# Patient Record
Sex: Male | Born: 1970 | Race: White | Hispanic: Yes | Marital: Married | Smoking: Light tobacco smoker
Health system: Southern US, Community
[De-identification: ages and names within clinical notes are randomized; demographics above are authoritative.]

## PROBLEM LIST (undated history)

## (undated) DIAGNOSIS — R7303 Prediabetes: Secondary | ICD-10-CM

## (undated) DIAGNOSIS — T7840XA Allergy, unspecified, initial encounter: Secondary | ICD-10-CM

## (undated) DIAGNOSIS — E785 Hyperlipidemia, unspecified: Secondary | ICD-10-CM

## (undated) DIAGNOSIS — E669 Obesity, unspecified: Secondary | ICD-10-CM

## (undated) DIAGNOSIS — I10 Essential (primary) hypertension: Secondary | ICD-10-CM

## (undated) HISTORY — DX: Allergy, unspecified, initial encounter: T78.40XA

## (undated) HISTORY — DX: Prediabetes: R73.03

## (undated) HISTORY — DX: Obesity, unspecified: E66.9

## (undated) HISTORY — DX: Hyperlipidemia, unspecified: E78.5

## (undated) HISTORY — DX: Essential (primary) hypertension: I10

---

## 1998-11-12 ENCOUNTER — Encounter: Admission: RE | Admit: 1998-11-12 | Discharge: 1998-11-12 | Payer: Self-pay | Admitting: Internal Medicine

## 1998-11-27 ENCOUNTER — Encounter: Admission: RE | Admit: 1998-11-27 | Discharge: 1998-11-27 | Payer: Self-pay | Admitting: Internal Medicine

## 1998-12-08 ENCOUNTER — Encounter: Admission: RE | Admit: 1998-12-08 | Discharge: 1998-12-08 | Payer: Self-pay | Admitting: Internal Medicine

## 1999-04-19 ENCOUNTER — Encounter: Admission: RE | Admit: 1999-04-19 | Discharge: 1999-04-19 | Payer: Self-pay | Admitting: Internal Medicine

## 2009-08-03 ENCOUNTER — Emergency Department (HOSPITAL_COMMUNITY): Admission: EM | Admit: 2009-08-03 | Discharge: 2009-08-03 | Payer: Self-pay | Admitting: Emergency Medicine

## 2013-10-01 ENCOUNTER — Emergency Department (HOSPITAL_COMMUNITY): Payer: Self-pay

## 2013-10-01 ENCOUNTER — Emergency Department (HOSPITAL_COMMUNITY)
Admission: EM | Admit: 2013-10-01 | Discharge: 2013-10-02 | Disposition: A | Discharge status: Home / Self Care | Payer: Self-pay | Attending: Emergency Medicine | Admitting: Emergency Medicine

## 2013-10-01 DIAGNOSIS — M545 Low back pain, unspecified: Secondary | ICD-10-CM

## 2013-10-01 DIAGNOSIS — S0993XA Unspecified injury of face, initial encounter: Secondary | ICD-10-CM | POA: Insufficient documentation

## 2013-10-01 DIAGNOSIS — IMO0002 Reserved for concepts with insufficient information to code with codable children: Secondary | ICD-10-CM | POA: Insufficient documentation

## 2013-10-01 DIAGNOSIS — R9431 Abnormal electrocardiogram [ECG] [EKG]: Secondary | ICD-10-CM | POA: Insufficient documentation

## 2013-10-01 DIAGNOSIS — R296 Repeated falls: Secondary | ICD-10-CM | POA: Insufficient documentation

## 2013-10-01 DIAGNOSIS — Y92009 Unspecified place in unspecified non-institutional (private) residence as the place of occurrence of the external cause: Secondary | ICD-10-CM | POA: Insufficient documentation

## 2013-10-01 DIAGNOSIS — R209 Unspecified disturbances of skin sensation: Secondary | ICD-10-CM | POA: Insufficient documentation

## 2013-10-01 DIAGNOSIS — R55 Syncope and collapse: Secondary | ICD-10-CM

## 2013-10-01 DIAGNOSIS — R011 Cardiac murmur, unspecified: Secondary | ICD-10-CM | POA: Insufficient documentation

## 2013-10-01 DIAGNOSIS — I10 Essential (primary) hypertension: Secondary | ICD-10-CM

## 2013-10-01 DIAGNOSIS — Y9389 Activity, other specified: Secondary | ICD-10-CM | POA: Insufficient documentation

## 2013-10-01 DIAGNOSIS — F1010 Alcohol abuse, uncomplicated: Secondary | ICD-10-CM | POA: Insufficient documentation

## 2013-10-01 DIAGNOSIS — W19XXXA Unspecified fall, initial encounter: Secondary | ICD-10-CM

## 2013-10-01 DIAGNOSIS — S199XXA Unspecified injury of neck, initial encounter: Secondary | ICD-10-CM

## 2013-10-01 DIAGNOSIS — R030 Elevated blood-pressure reading, without diagnosis of hypertension: Secondary | ICD-10-CM | POA: Insufficient documentation

## 2013-10-01 DIAGNOSIS — E876 Hypokalemia: Secondary | ICD-10-CM | POA: Insufficient documentation

## 2013-10-01 LAB — URINE MICROSCOPIC-ADD ON

## 2013-10-01 LAB — CBC
HCT: 39.2 % (ref 39.0–52.0)
HEMOGLOBIN: 14.1 g/dL (ref 13.0–17.0)
MCH: 31.4 pg (ref 26.0–34.0)
MCHC: 36 g/dL (ref 30.0–36.0)
MCV: 87.3 fL (ref 78.0–100.0)
PLATELETS: 259 10*3/uL (ref 150–400)
RBC: 4.49 MIL/uL (ref 4.22–5.81)
RDW: 13.6 % (ref 11.5–15.5)
WBC: 11.9 10*3/uL — ABNORMAL HIGH (ref 4.0–10.5)

## 2013-10-01 LAB — DIFFERENTIAL
BASOS ABS: 0.1 10*3/uL (ref 0.0–0.1)
BASOS PCT: 1 % (ref 0–1)
Eosinophils Absolute: 0.3 10*3/uL (ref 0.0–0.7)
Eosinophils Relative: 2 % (ref 0–5)
Lymphocytes Relative: 44 % (ref 12–46)
Lymphs Abs: 5.3 10*3/uL — ABNORMAL HIGH (ref 0.7–4.0)
MONO ABS: 0.7 10*3/uL (ref 0.1–1.0)
MONOS PCT: 6 % (ref 3–12)
NEUTROS ABS: 5.7 10*3/uL (ref 1.7–7.7)
NEUTROS PCT: 47 % (ref 43–77)

## 2013-10-01 LAB — URINALYSIS, ROUTINE W REFLEX MICROSCOPIC
BILIRUBIN URINE: NEGATIVE
Glucose, UA: 100 mg/dL — AB
Ketones, ur: NEGATIVE mg/dL
Leukocytes, UA: NEGATIVE
Nitrite: NEGATIVE
PH: 6 (ref 5.0–8.0)
Protein, ur: NEGATIVE mg/dL
Specific Gravity, Urine: 1.016 (ref 1.005–1.030)
Urobilinogen, UA: 0.2 mg/dL (ref 0.0–1.0)

## 2013-10-01 LAB — I-STAT TROPONIN, ED: Troponin i, poc: 0.03 ng/mL (ref 0.00–0.08)

## 2013-10-01 LAB — I-STAT CHEM 8, ED
BUN: 15 mg/dL (ref 6–23)
CALCIUM ION: 1.17 mmol/L (ref 1.12–1.23)
CREATININE: 1.3 mg/dL (ref 0.50–1.35)
Chloride: 103 mEq/L (ref 96–112)
GLUCOSE: 147 mg/dL — AB (ref 70–99)
HEMATOCRIT: 43 % (ref 39.0–52.0)
HEMOGLOBIN: 14.6 g/dL (ref 13.0–17.0)
Potassium: 3.1 mEq/L — ABNORMAL LOW (ref 3.7–5.3)
Sodium: 143 mEq/L (ref 137–147)
TCO2: 25 mmol/L (ref 0–100)

## 2013-10-01 LAB — PROTIME-INR
INR: 0.98 (ref 0.00–1.49)
Prothrombin Time: 12.8 seconds (ref 11.6–15.2)

## 2013-10-01 LAB — COMPREHENSIVE METABOLIC PANEL
ALBUMIN: 3.5 g/dL (ref 3.5–5.2)
ALK PHOS: 70 U/L (ref 39–117)
ALT: 71 U/L — AB (ref 0–53)
AST: 55 U/L — ABNORMAL HIGH (ref 0–37)
BILIRUBIN TOTAL: 0.3 mg/dL (ref 0.3–1.2)
BUN: 15 mg/dL (ref 6–23)
CO2: 23 mEq/L (ref 19–32)
CREATININE: 1.07 mg/dL (ref 0.50–1.35)
Calcium: 8.9 mg/dL (ref 8.4–10.5)
Chloride: 102 mEq/L (ref 96–112)
GFR calc Af Amer: >90 mL/min (ref >90)
GFR, EST NON AFRICAN AMERICAN: 83 mL/min — AB (ref >90)
Glucose, Bld: 146 mg/dL — ABNORMAL HIGH (ref 70–99)
Potassium: 3.3 mEq/L — ABNORMAL LOW (ref 3.7–5.3)
Sodium: 143 mEq/L (ref 137–147)
TOTAL PROTEIN: 7.7 g/dL (ref 6.0–8.3)

## 2013-10-01 LAB — CBG MONITORING, ED: Glucose-Capillary: 170 mg/dL — ABNORMAL HIGH (ref 70–99)

## 2013-10-01 LAB — ETHANOL: ALCOHOL ETHYL (B): 104 mg/dL — AB (ref 0–11)

## 2013-10-01 LAB — APTT: APTT: 28 s (ref 24–37)

## 2013-10-01 MED ORDER — SODIUM CHLORIDE 0.9 % IV BOLUS (SEPSIS)
1000.0000 mL | Freq: Once | INTRAVENOUS | Status: AC
  Administered 2013-10-02: 1000 mL via INTRAVENOUS

## 2013-10-01 MED ORDER — FENTANYL CITRATE 0.05 MG/ML IJ SOLN
50.0000 ug | Freq: Once | INTRAMUSCULAR | Status: DC
  Filled 2013-10-01: qty 2

## 2013-10-01 NOTE — Code Documentation (Signed)
43 yo hm brought in via [facility] s/p fall & possible seizure activity.  Per report pt was engaged in an argument on the phone when he suddenly fell flat on his back & began to shake.  Pt has no reported hx of seizure.  Pt has hx of HTN but is noncompliant with his HCTZ regimen.  [facility] blurred vision, lumbar pain, & BLE numbness.  After CT scan symptoms had improved to Rt hip pain & LLE slight numbness.  See doc flow sheet for code stroke times.  NIH 1 for sensory deficit.  Code stroke cancelled.

## 2013-10-01 NOTE — ED Notes (Addendum)
Pt to ED via [facility], code stroke called en route.  Family reports pt was standing having an argument when he dropped to the ground and had seizure like activity- no hx of same, pt reports blurred vision afterwards- denies weakness.  Pt fully immobilized upon arrival to ED due to fall, c/o of lower back pain and head pain.  Initial BP 240/140, last BP for EMS 200/124.  Pt Spanish speaking only.

## 2013-10-01 NOTE — Consult Note (Signed)
Referring Physician: Jodi Mourning    Chief Complaint: Syncope  HPI: Austin Moreno is a 43 y.o. male who was at home today after work in a heated discussion with someone on the phone.  The family heard a thud and found the patient on his back.  Some shaking was noted.  There was not tongue biting and no bowel or bladder incontinence.  EMS was called and the patient complained of blurry vision, back pain and lower extremity numbness.  Patient was brought in as a code stroke.  Symptoms began to improve in the ED.  NIHSS of 1.    Date last known well: Date: 10/01/2013 Time last known well: Time: 21:25 tPA Given: No: Improvement in symptoms  Past medical history: Hypertension  Past surgical history: None  Family history: Son alive and well  Social History:  The patient has no history of tobacco or illicit drug abuse.  He does drink alcohol on a daily basis.    Allergies: No Known Allergies  Medications: I have reviewed the patient's current medications. Prior to Admission:  Patient is noncompliant with his HCTZ and Neurontin  ROS: History obtained from the patient  General ROS: negative for - chills, fatigue, fever, night sweats, weight gain or weight loss Psychological ROS: negative for - behavioral disorder, hallucinations, memory difficulties, mood swings or suicidal ideation Ophthalmic ROS: negative for - blurry vision, double vision, eye pain or loss of vision ENT ROS: negative for - epistaxis, nasal discharge, oral lesions, sore throat, tinnitus or vertigo Allergy and Immunology ROS: negative for - hives or itchy/watery eyes Hematological and Lymphatic ROS: negative for - bleeding problems, bruising or swollen lymph nodes Endocrine ROS: negative for - galactorrhea, hair pattern changes, polydipsia/polyuria or temperature intolerance Respiratory ROS: negative for - cough, hemoptysis, shortness of breath or wheezing Cardiovascular ROS: negative for - chest pain, dyspnea on exertion,  edema or irregular heartbeat Gastrointestinal ROS: negative for - abdominal pain, diarrhea, hematemesis, nausea/vomiting or stool incontinence Genito-Urinary ROS: negative for - dysuria, hematuria, incontinence or urinary frequency/urgency Musculoskeletal ZOX:WRUE pain Neurological ROS: as noted in HPI Dermatological ROS: negative for rash and skin lesion changes  Physical Examination: Blood pressure 160/98, pulse 97, temperature 98.6 F (37 C), temperature source Oral, resp. rate 20, SpO2 100.00%.  Neurologic Examination: Mental Status: Alert, oriented, thought content appropriate.  Speech fluent without evidence of aphasia.  Able to follow 3 step commands without difficulty. Cranial Nerves: II: Discs flat bilaterally; Visual fields grossly normal, pupils equal, round, reactive to light and accommodation III,IV, VI: ptosis not present, extra-ocular motions intact bilaterally V,VII: smile symmetric, facial light touch sensation normal bilaterally VIII: hearing normal bilaterally IX,X: gag reflex present XI: bilateral shoulder shrug XII: midline tongue extension Motor: Right : Upper extremity   5/5    Left:     Upper extremity   5/5  Lower extremity   5/5     Lower extremity   5/5 Tone and bulk:normal tone throughout; no atrophy noted Sensory: Pinprick and light touch decreased in the LLE Deep Tendon Reflexes: 2+ throughout with absent AJ's bilaterally Plantars: Right: mute   Left: mute Cerebellar: normal finger-to-nose and normal heel-to-shin test Gait: Unable to test CV: pulses palpable throughout     Laboratory Studies:  Basic Metabolic Panel:  Recent Labs Lab 10/01/13 2215 10/01/13 2224  NA 143 143  K 3.3* 3.1*  CL 102 103  CO2 23  --   GLUCOSE 146* 147*  BUN 15 15  CREATININE 1.07 1.30  CALCIUM  8.9  --     Liver Function Tests:  Recent Labs Lab 10/01/13 2215  AST 55*  ALT 71*  ALKPHOS 70  BILITOT 0.3  PROT 7.7  ALBUMIN 3.5   No results found for  this basename: LIPASE, AMYLASE,  in the last 168 hours No results found for this basename: AMMONIA,  in the last 168 hours  CBC:  Recent Labs Lab 10/01/13 2215 10/01/13 2224  WBC 11.9*  --   NEUTROABS 5.7  --   HGB 14.1 14.6  HCT 39.2 43.0  MCV 87.3  --   PLT 259  --     Cardiac Enzymes: No results found for this basename: CKTOTAL, CKMB, CKMBINDEX, TROPONINI,  in the last 168 hours  BNP: No components found with this basename: POCBNP,   CBG:  Recent Labs Lab 10/01/13 2236  GLUCAP 170*    Microbiology: No results found for this or any previous visit.  Coagulation Studies:  Recent Labs  10/01/13 2215  LABPROT 12.8  INR 0.98    Urinalysis: No results found for this basename: COLORURINE, APPERANCEUR, LABSPEC, PHURINE, GLUCOSEU, HGBUR, BILIRUBINUR, KETONESUR, PROTEINUR, UROBILINOGEN, NITRITE, LEUKOCYTESUR,  in the last 168 hours  Lipid Panel: No results found for this basename: chol, trig, hdl, cholhdl, vldl, ldlcalc    HgbA1C:  No results found for this basename: HGBA1C    Urine Drug Screen:   No results found for this basename: labopia, cocainscrnur, labbenz, amphetmu, thcu, labbarb    Alcohol Level:  Recent Labs Lab 10/01/13 2215  ETH 104*    Other results: EKG: sinus rhythm at 97 bpm.  Imaging: Ct Head Wo Contrast  10/01/2013   CLINICAL DATA:  Code stroke.  Blurred vision and syncope.  EXAM: CT HEAD WITHOUT CONTRAST  CT CERVICAL SPINE WITHOUT CONTRAST  TECHNIQUE: Multidetector CT imaging of the head and cervical spine was performed following the standard protocol without intravenous contrast. Multiplanar CT image reconstructions of the cervical spine were also generated.  COMPARISON:  None.  FINDINGS: CT HEAD FINDINGS  Skull and Sinuses:No significant abnormality.  Orbits: No acute abnormality.  Brain: No evidence of acute abnormality, such as acute infarction, hemorrhage, hydrocephalus, or mass lesion/mass effect. There is multi focal coarse  parenchymal calcification, noted in the peripheral bilateral cerebral hemispheres and in the left thalamus. No evidence of surrounding edema. No mass effect.  CT CERVICAL SPINE FINDINGS  Negative for acute fracture or subluxation. No prevertebral edema. No gross cervical canal hematoma. There is likely a disc herniation centrally at C6-7, unlikely to cause significant canal stenosis. No significant osseous canal or foraminal stenosis.  These results were called by telephone at the time of interpretation on 10/01/2013 at 10:37 PM to Dr. Marena Chancy , who verbally acknowledged these results.  IMPRESSION: 1. Negative for hemorrhage or acute intracranial infarct. 2. 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 No evidence of edema.   Electronically Signed   By: Tiburcio Pea M.D.   On: 10/01/2013 22:38   Ct Cervical Spine Wo Contrast  10/01/2013   CLINICAL DATA:  Code stroke.  Blurred vision and syncope.  EXAM: CT HEAD WITHOUT CONTRAST  CT CERVICAL SPINE WITHOUT CONTRAST  TECHNIQUE: Multidetector CT imaging of the head and cervical spine was performed following the standard protocol without intravenous contrast. Multiplanar CT image reconstructions of the cervical spine were also generated.  COMPARISON:  None.  FINDINGS: CT HEAD FINDINGS  Skull and Sinuses:No significant abnormality.  Orbits: No acute abnormality.  Brain: No evidence of acute  abnormality, such as acute infarction, hemorrhage, hydrocephalus, or mass lesion/mass effect. There is multi focal coarse parenchymal calcification, noted in the peripheral bilateral cerebral hemispheres and in the left thalamus. No evidence of surrounding edema. No mass effect.  CT CERVICAL SPINE FINDINGS  Negative for acute fracture or subluxation. No prevertebral edema. No gross cervical canal hematoma. There is likely a disc herniation centrally at C6-7, unlikely to cause significant canal stenosis. No significant osseous canal or foraminal  stenosis.  These results were called by telephone at the time of interpretation on 10/01/2013 at 10:37 PM to Dr. Marena ChancyWILLIAM WALDEN , who verbally acknowledged these results.  IMPRESSION: 1. Negative for hemorrhage or acute intracranial infarct. 2. 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 No evidence of edema.   Electronically Signed   By: Tiburcio PeaJonathan  Watts M.D.   On: 10/01/2013 22:38    Assessment: 43 y.o. male presenting after a syncopal episode with numbness and blurry vision.  His symptoms are improving in the ED.  BP is elevated and patient is noncompliant with medication.  Presentation not felt to be indicative of an acute infarct.  I feel it is difficult to say with some definity that he had a seizure as well.  Head CT reviewed and shows no acute changes but does show some areas of calcification.    Stroke Risk Factors - hypertension  Plan: 1. Due to continued back pain would image the lumbar spine 2.  No further neurologic intervention is recommended at this time.  If further questions arise, please call or page at that time.  Thank you for allowing neurology to participate in the care of this patient.  Anticonvulsant therapy not indicated at this time.    Thana FarrLeslie Roselani Grajeda, MD [facility] [phone] 10/02/2013  12:11 AM

## 2013-10-02 ENCOUNTER — Emergency Department (HOSPITAL_COMMUNITY): Payer: Self-pay

## 2013-10-02 DIAGNOSIS — R55 Syncope and collapse: Secondary | ICD-10-CM

## 2013-10-02 LAB — RAPID URINE DRUG SCREEN, HOSP PERFORMED
AMPHETAMINES: NOT DETECTED
BARBITURATES: NOT DETECTED
BENZODIAZEPINES: NOT DETECTED
COCAINE: NOT DETECTED
Opiates: NOT DETECTED
Tetrahydrocannabinol: NOT DETECTED

## 2013-10-02 LAB — I-STAT TROPONIN, ED: Troponin i, poc: 0.04 ng/mL (ref 0.00–0.08)

## 2013-10-02 MED ORDER — POTASSIUM CHLORIDE CRYS ER 20 MEQ PO TBCR
40.0000 meq | EXTENDED_RELEASE_TABLET | Freq: Once | ORAL | Status: AC
  Administered 2013-10-02: 40 meq via ORAL
  Filled 2013-10-02: qty 2

## 2013-10-02 NOTE — ED Provider Notes (Signed)
CSN: [id number]     Arrival date & time 10/01/13  2209 History   First MD Initiated Contact with Patient 10/01/13 2254     Chief Complaint  Patient presents with  . Code Stroke    An emergency department physician performed an initial assessment on this suspected stroke patient at 2209. (Consider location/radiation/quality/duration/timing/severity/associated sxs/prior Treatment) HPI Comments: 43 year old male with no medical or surgical history presents after a fall prior to arrival. Patient was at work today for which she normally and had lower back pain throughout the day. He got home and his lower back pain worsened leading to numbness in both legs causing and likes to give out leading him to fall onto his back. He had no chest pain, shortness of breath, bowel or bladder changes, headache prior to or afterwards.  He denies syncope and recalls all events.  Patient is not on blood thinners. Currently patient has mild neck and lumbar back pain worse with movement.Patient denies urinary or bowel changes, active cancer, extremity weakness, IVDU, fevers, immunosuppression or significant trauma.   The history is provided by the patient. A language interpreter was used.    No past medical history on file. No past surgical history on file. No family history on file. History  Substance Use Topics  . Smoking status: Not on file  . Smokeless tobacco: Not on file  . Alcohol Use: Not on file    Review of Systems  Constitutional: Negative for fever and chills.  HENT: Negative for congestion.   Eyes: Negative for visual disturbance.  Respiratory: Negative for shortness of breath.   Cardiovascular: Negative for chest pain.  Gastrointestinal: Negative for vomiting and abdominal pain.  Genitourinary: Negative for dysuria and flank pain.  Musculoskeletal: Positive for back pain and neck pain. Negative for neck stiffness.  Skin: Negative for rash.  Neurological: Positive for numbness. Negative for  syncope, weakness, light-headedness and headaches.      Allergies  Review of patient's allergies indicates no known allergies.  Home Medications  No current outpatient prescriptions on file. BP 160/98  Pulse 97  Temp(Src) 98.6 F (37 C) (Oral)  Resp 20  SpO2 100% Physical Exam  Nursing note and vitals reviewed. Constitutional: He is oriented to person, place, and time. He appears well-developed and well-nourished.  HENT:  Head: Normocephalic and atraumatic.  Eyes: Conjunctivae are normal. Right eye exhibits no discharge. Left eye exhibits no discharge.  Neck: Normal range of motion. Neck supple. No tracheal deviation present.  Cardiovascular: Normal rate and regular rhythm.   Murmur (2+ SM LSB) heard. Pulmonary/Chest: Effort normal and breath sounds normal.  Abdominal: Soft. He exhibits no distension. There is no tenderness. There is no guarding.  Musculoskeletal: He exhibits tenderness. He exhibits no edema.  Mild tenderness midline lumbar and paraspinal lumbar, mild tenderness lower cervical spinal, full range of motion of head and neck without significant pain.  Neurological: He is alert and oriented to person, place, and time. Coordination normal. GCS eye subscore is 4. GCS verbal subscore is 5. GCS motor subscore is 6.  5+ strength in UE and LE with f/e at major joints. Sensation to palpation intact in UE and LE. CNs 2-12 grossly intact.  EOMFI.  PERRL.   Finger nose and coordination intact bilateral.   Visual fields intact to finger testing.   Skin: Skin is warm. No rash noted.  Psychiatric: He has a normal mood and affect.    ED Course  Procedures (including critical care time) Labs Review Labs  Reviewed  ETHANOL - Abnormal; Notable for the following:    Alcohol, Ethyl (B) 104 (*)    All other components within normal limits  CBC - Abnormal; Notable for the following:    WBC 11.9 (*)    All other components within normal limits  DIFFERENTIAL - Abnormal; Notable  for the following:    Lymphs Abs 5.3 (*)    All other components within normal limits  COMPREHENSIVE METABOLIC PANEL - Abnormal; Notable for the following:    Potassium 3.3 (*)    Glucose, Bld 146 (*)    AST 55 (*)    ALT 71 (*)    GFR calc non Af Amer 83 (*)    All other components within normal limits  URINALYSIS, ROUTINE W REFLEX MICROSCOPIC - Abnormal; Notable for the following:    APPearance CLOUDY (*)    Glucose, UA 100 (*)    Hgb urine dipstick SMALL (*)    All other components within normal limits  I-STAT CHEM 8, ED - Abnormal; Notable for the following:    Potassium 3.1 (*)    Glucose, Bld 147 (*)    All other components within normal limits  CBG MONITORING, ED - Abnormal; Notable for the following:    Glucose-Capillary 170 (*)    All other components within normal limits  PROTIME-INR  APTT  URINE MICROSCOPIC-ADD ON  URINE RAPID DRUG SCREEN (HOSP PERFORMED)  I-STAT TROPOININ, ED  I-STAT TROPOININ, ED   Imaging Review Ct Head Wo Contrast  10/01/2013   CLINICAL DATA:  Code stroke.  Blurred vision and syncope.  EXAM: CT HEAD WITHOUT CONTRAST  CT CERVICAL SPINE WITHOUT CONTRAST  TECHNIQUE: Multidetector CT imaging of the head and cervical spine was performed following the standard protocol without intravenous contrast. Multiplanar CT image reconstructions of the cervical spine were also generated.  COMPARISON:  None.  FINDINGS: CT HEAD FINDINGS  Skull and Sinuses:No significant abnormality.  Orbits: No acute abnormality.  Brain: No evidence of acute abnormality, such as acute infarction, hemorrhage, hydrocephalus, or mass lesion/mass effect. There is multi focal coarse parenchymal calcification, noted in the peripheral bilateral cerebral hemispheres and in the left thalamus. No evidence of surrounding edema. No mass effect.  CT CERVICAL SPINE FINDINGS  Negative for acute fracture or subluxation. No prevertebral edema. No gross cervical canal hematoma. There is likely a disc  herniation centrally at C6-7, unlikely to cause significant canal stenosis. No significant osseous canal or foraminal stenosis.  These results were called by telephone at the time of interpretation on 10/01/2013 at 10:37 PM to Dr. Marena Chancy , who verbally acknowledged these results.  IMPRESSION: 1. Negative for hemorrhage or acute intracranial infarct. 2. 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 No evidence of edema.   Electronically Signed   By: Tiburcio Pea M.D.   On: 10/01/2013 22:38   Ct Cervical Spine Wo Contrast  10/01/2013   CLINICAL DATA:  Code stroke.  Blurred vision and syncope.  EXAM: CT HEAD WITHOUT CONTRAST  CT CERVICAL SPINE WITHOUT CONTRAST  TECHNIQUE: Multidetector CT imaging of the head and cervical spine was performed following the standard protocol without intravenous contrast. Multiplanar CT image reconstructions of the cervical spine were also generated.  COMPARISON:  None.  FINDINGS: CT HEAD FINDINGS  Skull and Sinuses:No significant abnormality.  Orbits: No acute abnormality.  Brain: No evidence of acute abnormality, such as acute infarction, hemorrhage, hydrocephalus, or mass lesion/mass effect. There is multi focal coarse parenchymal calcification, noted in the peripheral bilateral  cerebral hemispheres and in the left thalamus. No evidence of surrounding edema. No mass effect.  CT CERVICAL SPINE FINDINGS  Negative for acute fracture or subluxation. No prevertebral edema. No gross cervical canal hematoma. There is likely a disc herniation centrally at C6-7, unlikely to cause significant canal stenosis. No significant osseous canal or foraminal stenosis.  These results were called by telephone at the time of interpretation on 10/01/2013 at 10:37 PM to Dr. Marena Chancy , who verbally acknowledged these results.  IMPRESSION: 1. Negative for hemorrhage or acute intracranial infarct. 2. 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  No evidence of edema.   Electronically Signed   By: Tiburcio Pea M.D.   On: 10/01/2013 22:38     EKG Interpretation   Date/Time:  Tuesday October 01 2013 22:50:50 EDT Ventricular Rate:  97 PR Interval:  145 QRS Duration: 86 QT Interval:  343 QTC Calculation: 436 R Axis:   72 Text Interpretation:  Sinus rhythm Borderline repolarization abnormality  Non specific ST changes Confirmed by Jodi Mourning  MD, Nikitia Asbill ([id number]) on 10/02/2013  2:01:41 AM     Repeat EKG  Date: 10/02/2013  Rate: 93  Rhythm: normal sinus rhythm  QRS Axis: normal  Intervals: normal  ST/T Wave abnormalities: nonspecific ST changes and nonspecific T wave changes  Conduction Disutrbances:none  Narrative Interpretation:   Old EKG Reviewed: unchanged    MDM   Final diagnoses:  Lumbar back pain  Fall  Alcohol abuse  Hypokalemia  Heart murmur  High blood pressure   Clinically patient had worsening musculoskeletal versus lumbar disc pain leading to a fall. Patient has no significant medical history however he also doesn't followup the patient. Patient has a normal neuro exam including normal sensation and strength in the lower extremities in all major nerves. Patient initially had a stroke workup done. He has no signs of acute stroke on exam. Plan for pain meds, IV fluid bolus and followup imaging results. Family is in the room with patient and interpreter used. Patient did have a systolic murmur on exam no history of known murmur, no syncope or lightheadedness, discussed close outpatient followup with patient. PO K given.  Recheck patient feels improved. Normal neurologic exam. Discussed results. Discussed importance of primary care physician to followup multiple abnormalities. Patient denies syncope, chest pain, shortness of breath.  I do not feel this was an acute cardiac event. Delta to troponin negative. EKG abnormal however no acute changes on repeat.  Blood pressure elevated likely combination of pain and  underlying high blood pressure. No signs of endorgan damage clinically. Patient will followup with the primary care provider for further evaluation of heart murmur, high blood pressure, back pain. Strict reasons to return given including passing out, chest pain, fevers, leg weakness, numbness, bowel or bladder changes, other. Family in the room during this discussion. Results and differential diagnosis were discussed with the patient. Close follow up outpatient was discussed, patient comfortable with the plan.   Filed Vitals:   10/01/13 2327 10/01/13 2345 10/02/13 0209 10/02/13 0239  BP: 160/98 158/100 161/86 159/95  Pulse: 97 102 90 89  Temp:      TempSrc:      Resp: 20 19 18 22   SpO2: 100% 100% 99% 96%         Enid Skeens, MD 10/02/13 [phone]

## 2013-10-02 NOTE — Discharge Instructions (Signed)
If you were given medicines take as directed.  If you are on coumadin or contraceptives realize their levels and effectiveness is altered by many different medicines.  If you have any reaction (rash, tongues swelling, other) to the medicines stop taking and see a physician.   Please follow up as directed and return to the ER or see a physician for new or worsening symptoms.  Thank you.  It is very important for you to obtain a primary care physician to monitor your blood pressure, your heart murmur and your overall health. If you have any weakness or recurrent numbness in your legs, changes in your bladder or urination, fevers, passing out or any concerns return to the ER immediately.  Si le dieron medicamentos segn las indicaciones . Si usted est en Coumadin o anticonceptivos realidad sus niveles y la eficacia se ve alterada por muchos medicamentos diferentes . Si usted tiene Freight forwarderalguna reaccin (sarpullido , hinchazn lenguas , otros) a los medicamentos deje de tomar y Patent attorneyver a un mdico. Por favor, seguimiento como se indica y Programme researcher, broadcasting/film/videovolver a la sala de Sports administratoremergencias o ver a un mdico para los sntomas nuevos o que [place] . Gracias.  Es muy importante que usted obtenga un mdico de atencin primaria para Chief Operating Officercontrolar su presin arterial, su soplo en el corazn y Special educational needs teachersu salud en general . Si usted tiene alguna debilidad o entumecimiento recurrente en las piernas , los cambios en la vejiga o al Alexanderorinar , [place]bre, [place]o o cualquier preocupacin regresar a la sala de emergencias de inmediato . Filed Vitals:   10/01/13 2244 10/01/13 2315 10/01/13 2327 10/01/13 2345  BP: 186/106 160/98 160/98 158/100  Pulse: 98 97 97 102  Temp: 98.6 F (37 C)     TempSrc: Oral     Resp: 18 18 20 19   SpO2: 100% 100% 100% 100%

## 2014-07-04 DIAGNOSIS — E669 Obesity, unspecified: Secondary | ICD-10-CM

## 2014-07-04 HISTORY — DX: Obesity, unspecified: E66.9

## 2014-12-11 ENCOUNTER — Ambulatory Visit: Payer: Self-pay

## 2015-10-29 ENCOUNTER — Ambulatory Visit: Payer: Self-pay | Admitting: Internal Medicine

## 2015-12-03 ENCOUNTER — Ambulatory Visit (INDEPENDENT_AMBULATORY_CARE_PROVIDER_SITE_OTHER): Payer: Self-pay | Admitting: Internal Medicine

## 2015-12-03 ENCOUNTER — Encounter: Payer: Self-pay | Admitting: Internal Medicine

## 2015-12-03 VITALS — BP 180/112 | HR 90 | Temp 98.6°F | Resp 20 | Ht 61.75 in | Wt 200.0 lb

## 2015-12-03 DIAGNOSIS — I10 Essential (primary) hypertension: Secondary | ICD-10-CM | POA: Insufficient documentation

## 2015-12-03 DIAGNOSIS — E785 Hyperlipidemia, unspecified: Secondary | ICD-10-CM | POA: Insufficient documentation

## 2015-12-03 DIAGNOSIS — T7840XA Allergy, unspecified, initial encounter: Secondary | ICD-10-CM | POA: Insufficient documentation

## 2015-12-03 DIAGNOSIS — R7303 Prediabetes: Secondary | ICD-10-CM

## 2015-12-03 DIAGNOSIS — R7309 Other abnormal glucose: Secondary | ICD-10-CM

## 2015-12-03 LAB — GLUCOSE, POCT (MANUAL RESULT ENTRY): POC Glucose: 83 mg/dl (ref 70–99)

## 2015-12-03 MED ORDER — METOPROLOL TARTRATE 50 MG PO TABS
50.0000 mg | ORAL_TABLET | Freq: Two times a day (BID) | ORAL | Status: DC
Start: 2015-12-03 — End: 2016-08-16

## 2015-12-03 NOTE — Patient Instructions (Signed)

## 2015-12-03 NOTE — Progress Notes (Signed)
   Subjective:    Patient ID: Austin Moreno, male    DOB: 09/30/1970, 45 y.o.   MRN: [id number]  HPI  Has not been here in 1 year.  Records in [facility]   1. Essential Hypertension:  Stopped taking Metoprolol as he was feeling well.  States he actually only took the medicine for 1 month and then did not refill. His wife told him to take his medicine when he started feeling poorly 3 months ago, but he wanted to wait until he came to be seen.   Feeling dizzy mainly--spinning sensation.  2.  Prediabetes:  Last visit 1 year ago this was first addressed.  A1C:  5.7%.  Describes a fair diet, but eats breakfast biscuits a lot in the morning and then nothing until dinner at night.    3.  Hypercholesterolemia with good HDL in the past.  Again, long discussion regarding diet.  4. Decreased Visual Acuity:  Needs optometry appt. And likely glasses, describes presbyopia.  Meds:  None save for Alka Seltzer for headaches  No Known Allergies      Review of Systems     Objective:   Physical Exam NAD Lungs:  CTA CV:  RRR with normal S1 and S2, No S3, S4 or murmur.   Radial pulses normal and equal No LE edema       Assessment & Plan:  1.  Essential Hypertension:  Long discussion regarding likely life long treatment of his hypertension. Restart Metoprolol 50 mg twice daily BP check with nurse in 1 week. F/U with me in 2 months  2.  Prediabetes:  Long discussion of lifestyle and recommended changes with diet and physical activity.  3.  Hyperlipidemia:  As above.

## 2016-02-05 ENCOUNTER — Ambulatory Visit: Payer: Self-pay | Admitting: Internal Medicine

## 2016-03-31 ENCOUNTER — Ambulatory Visit: Payer: Self-pay | Admitting: Internal Medicine

## 2016-07-11 ENCOUNTER — Ambulatory Visit: Payer: Self-pay

## 2016-08-16 ENCOUNTER — Ambulatory Visit (INDEPENDENT_AMBULATORY_CARE_PROVIDER_SITE_OTHER): Payer: Self-pay | Admitting: Internal Medicine

## 2016-08-16 ENCOUNTER — Encounter: Payer: Self-pay | Admitting: Internal Medicine

## 2016-08-16 VITALS — BP 139/110 | HR 68 | Resp 12 | Ht 62.0 in | Wt 217.0 lb

## 2016-08-16 DIAGNOSIS — E669 Obesity, unspecified: Secondary | ICD-10-CM | POA: Insufficient documentation

## 2016-08-16 DIAGNOSIS — Z6839 Body mass index (BMI) 39.0-39.9, adult: Secondary | ICD-10-CM

## 2016-08-16 DIAGNOSIS — E6609 Other obesity due to excess calories: Secondary | ICD-10-CM

## 2016-08-16 DIAGNOSIS — IMO0001 Reserved for inherently not codable concepts without codable children: Secondary | ICD-10-CM

## 2016-08-16 DIAGNOSIS — Z79899 Other long term (current) drug therapy: Secondary | ICD-10-CM

## 2016-08-16 DIAGNOSIS — Z23 Encounter for immunization: Secondary | ICD-10-CM

## 2016-08-16 DIAGNOSIS — I10 Essential (primary) hypertension: Secondary | ICD-10-CM

## 2016-08-16 DIAGNOSIS — R7303 Prediabetes: Secondary | ICD-10-CM

## 2016-08-16 MED ORDER — AMLODIPINE BESYLATE 10 MG PO TABS: 10.0000 mg | ORAL_TABLET | Freq: Every day | ORAL | 11 refills | Status: DC

## 2016-08-16 NOTE — Progress Notes (Signed)
   Subjective:    Patient ID: Austin Moreno, male    DOB: 09/27/1970, 46 y.o.   MRN: [id number]  HPI   1.  Essential Hypertension:  Not taking Metoprolol as prescribed.  Has not filled for some time and often was forgetting his evening dose. Has also gained 17 lbs since seen in June.  States he was not working in past month so sat at home and ate. Using Herbalife drinks this month as well. Drinking  3 sodas daily.     2.  Prediabetes:  As above.  Long discussion regarding need to take care of his health so he can continue to work. He is fasting today.  3.  Decreased vision:  Noted he is not able to read his bp on chart today.  Has not tried reading glasses.  Discussed will need eye referral if has developed DM at this point with weight gain and poor diet.  4.  Obesity:  As above.    Current Meds  Medication Sig  . aspirin-sod bicarb-citric acid (ALKA-SELTZER) 325 MG TBEF tablet Take 325 mg by mouth as needed.  . fexofenadine (ALLEGRA) 180 MG tablet Take 180 mg by mouth daily. Reported on 12/03/2015       Review of Systems     Objective:   Physical Exam  Obese, NAD HEENT:  PERRL, EOMI, pinguecula, right nasal eye. Lungs:  CTA CV:  RRR with normal S1 and S2, No S3, S4 or murmur, radial pulses normal and equal.  No LE edema Abd:  Obese         Assessment & Plan:  1.  Essential Hypertension:  Remains noncompliant with poor follow up/lpoor lifestyle choices with diet and physical activity and difficulties maintaining med intake. Switch to once daily Amlodipine.   BP and pulse check in 1 month   2.  Prediabetes:  A1C.  Long discussion again about the risk he is taking with his ability to support family when he does not take care of his health.  Discussed spending equivalent of $240 monthly on Herbalife and not making simple dietary changes that would save money does not make sense. Discussed would like him to bring his wife in with him next visit to discuss family's  diet as she cooks for family  3.  Obesity: strongly urged to make lifestyle changes as above.  Discussed how obesity worsens both problems above.    4.  Decreased visual acuity:  Recommended trying reading glasses.  If in Diabetic range, will send for diabetic eye exam.

## 2016-08-16 NOTE — Patient Instructions (Signed)

## 2016-08-17 LAB — BASIC METABOLIC PANEL
BUN / CREAT RATIO: 18 (ref 9–20)
BUN: 17 mg/dL (ref 6–24)
CHLORIDE: 100 mmol/L (ref 96–106)
CO2: 25 mmol/L (ref 18–29)
CREATININE: 0.96 mg/dL (ref 0.76–1.27)
Calcium: 9.4 mg/dL (ref 8.7–10.2)
GFR calc Af Amer: 109 mL/min/{1.73_m2} (ref >59)
GFR calc non Af Amer: 94 mL/min/{1.73_m2} (ref >59)
Glucose: 103 mg/dL — ABNORMAL HIGH (ref 65–99)
Potassium: 4.5 mmol/L (ref 3.5–5.2)
SODIUM: 141 mmol/L (ref 134–144)

## 2016-08-17 LAB — HGB A1C W/O EAG: Hgb A1c MFr Bld: 6.4 % — ABNORMAL HIGH (ref 4.8–5.6)

## 2016-08-21 ENCOUNTER — Encounter: Payer: Self-pay | Admitting: Internal Medicine

## 2016-08-31 NOTE — Progress Notes (Signed)
Patient was called with lab results.

## 2016-11-10 ENCOUNTER — Ambulatory Visit (INDEPENDENT_AMBULATORY_CARE_PROVIDER_SITE_OTHER): Payer: Self-pay | Admitting: Internal Medicine

## 2016-11-10 ENCOUNTER — Encounter: Payer: Self-pay | Admitting: Internal Medicine

## 2016-11-10 VITALS — BP 142/98 | HR 82 | Resp 12 | Ht 62.0 in | Wt 204.0 lb

## 2016-11-10 DIAGNOSIS — T7840XD Allergy, unspecified, subsequent encounter: Secondary | ICD-10-CM

## 2016-11-10 DIAGNOSIS — R7303 Prediabetes: Secondary | ICD-10-CM

## 2016-11-10 DIAGNOSIS — I10 Essential (primary) hypertension: Secondary | ICD-10-CM

## 2016-11-10 DIAGNOSIS — Z6839 Body mass index (BMI) 39.0-39.9, adult: Secondary | ICD-10-CM

## 2016-11-10 MED ORDER — LISINOPRIL 10 MG PO TABS: 10.0000 mg | ORAL_TABLET | Freq: Every day | ORAL | 11 refills | Status: DC

## 2016-11-10 MED ORDER — MOMETASONE FUROATE 50 MCG/ACT NA SUSP: NASAL | 12 refills | Status: DC

## 2016-11-10 NOTE — Progress Notes (Signed)
   Subjective:    Patient ID: Austin Moreno, male    DOB: 09/19/1970, 46 y.o.   MRN: [id number]  HPI   1.  Essential Hypertension:  Never misses Amlodipine.  2.  Seasonal Allergies:  [place] and received an unknown injection for his allergies.  Tried all sorts of medications over the counter, but not Allegra.   Has symptoms every spring Discussed Nasal corticosteroids and oral daily antihistamines.  3.  Prediabetes:  A1C in February was 6.4%.  Changed diet and remains physically active.  Has lost 13 lbs.   Stopped eating tortillas.   Cut back on red meat, less fast food. Bringing lunch to work more often and eating healthier.   Describes a really healthy breakfast with spinach, fruit, oatmeal.  Current Meds  Medication Sig  . amLODipine (NORVASC) 10 MG tablet Take 1 tablet (10 mg total) by mouth daily.  Marland Kitchen. aspirin-sod bicarb-citric acid (ALKA-SELTZER) 325 MG TBEF tablet Take 325 mg by mouth as needed.    No Known Allergies    Review of Systems     Objective:   Physical Exam NAD HEENT:  PERRL, EOMI, no injection of conjunctivae, TMs pearly gray.  Throat with mild cobbling. Neck:  Supple, No adenopathy Chest:  CTA CV:  RRR without murmur or rub LE:  No edema       Assessment & Plan:  1.  Essential Hypertension:  Continues to improve, but still not at goal.  Continue lifestyle changes.  Add Lisinopril 10 mg daily.  Continue Amlodipine. BP check with BMP in 2 weeks  2.  Seasonal Allergies:  Fexofenadine 180 mg and Nasonex 2 sprays each nostril daily.  May purchase OTC while awaiting MAP for the latter.    3.  Prediabetes:  A1C today  4.  History of hyperlipidemia:  FLP with follow up bp in 2 weeks.

## 2016-11-11 LAB — HGB A1C W/O EAG: HEMOGLOBIN A1C: 6.2 % — AB (ref 4.8–5.6)

## 2016-11-29 ENCOUNTER — Other Ambulatory Visit (INDEPENDENT_AMBULATORY_CARE_PROVIDER_SITE_OTHER): Payer: Self-pay

## 2016-11-29 VITALS — BP 122/82 | HR 68

## 2016-11-29 DIAGNOSIS — Z79899 Other long term (current) drug therapy: Secondary | ICD-10-CM

## 2016-11-29 DIAGNOSIS — Z13220 Encounter for screening for lipoid disorders: Secondary | ICD-10-CM

## 2016-11-29 NOTE — Progress Notes (Signed)
Patient Blood Pressure was normal when checking. Advised to continue with treatment he ia already doing. Per Dr. Delrae AlfredMulberry she agrees.

## 2016-11-30 LAB — LIPID PANEL W/O CHOL/HDL RATIO
CHOLESTEROL TOTAL: 180 mg/dL (ref 100–199)
HDL: 47 mg/dL (ref >39)
LDL Calculated: 110 mg/dL — ABNORMAL HIGH (ref 0–99)
Triglycerides: 114 mg/dL (ref 0–149)
VLDL Cholesterol Cal: 23 mg/dL (ref 5–40)

## 2016-11-30 LAB — BASIC METABOLIC PANEL
BUN / CREAT RATIO: 13 (ref 9–20)
BUN: 13 mg/dL (ref 6–24)
CHLORIDE: 104 mmol/L (ref 96–106)
CO2: 24 mmol/L (ref 18–29)
Calcium: 9.1 mg/dL (ref 8.7–10.2)
Creatinine, Ser: 1 mg/dL (ref 0.76–1.27)
GFR calc Af Amer: 104 mL/min/{1.73_m2} (ref >59)
GFR calc non Af Amer: 90 mL/min/{1.73_m2} (ref >59)
GLUCOSE: 106 mg/dL — AB (ref 65–99)
POTASSIUM: 4.9 mmol/L (ref 3.5–5.2)
SODIUM: 140 mmol/L (ref 134–144)

## 2017-02-09 ENCOUNTER — Ambulatory Visit (INDEPENDENT_AMBULATORY_CARE_PROVIDER_SITE_OTHER): Payer: Self-pay | Admitting: Internal Medicine

## 2017-02-09 ENCOUNTER — Encounter: Payer: Self-pay | Admitting: Internal Medicine

## 2017-02-09 VITALS — BP 138/82 | HR 76 | Resp 12 | Ht 62.0 in | Wt 206.0 lb

## 2017-02-09 DIAGNOSIS — G44229 Chronic tension-type headache, not intractable: Secondary | ICD-10-CM

## 2017-02-09 DIAGNOSIS — R7303 Prediabetes: Secondary | ICD-10-CM

## 2017-02-09 DIAGNOSIS — I10 Essential (primary) hypertension: Secondary | ICD-10-CM

## 2017-02-09 DIAGNOSIS — H547 Unspecified visual loss: Secondary | ICD-10-CM

## 2017-02-09 DIAGNOSIS — Z6839 Body mass index (BMI) 39.0-39.9, adult: Secondary | ICD-10-CM

## 2017-02-09 NOTE — Progress Notes (Signed)
   Subjective:    Patient ID: Austin Moreno, male    DOB: 09/01/1970, 46 y.o.   MRN: [id number]  HPI  Daughter interprets today  1.  Essential Hypertension:  Tolerating medication:  Amlodipine   2.  Obesity:  Eating a biscuit for breakfast every 3 days.  Sausage and egg.  Wife is not overweight and eats in a healthy way.  He eats more red meat than her.  She is working on him to eat more vegetables. Discussed working with Henreitta CeaAndy Fox and diabetes prevention. Not physically active outside of work. Played baseball in his younger days.  Discussed playing in backyard with grandkids after work.    3.  Prediabetes:  A1C went down to 6.2 % from 6.4% with lifestyle changes.  Has gained 2 lbs back from May visit.    4.  Blurry vision:  Reading glasses have helped,but still with blurry vision.  Would like to have eye exam.  Does not have orange card.  Will apply for the latter so we can refer to optometry.  5.  Nuchal headaches:  Years.  Every day since young.  No history of injury to his neck.  Does have chronic shoulder pain.  Takes Catering managerAlka Seltzer for the pain, which helps   Current Meds  Medication Sig  . amLODipine (NORVASC) 10 MG tablet Take 1 tablet (10 mg total) by mouth daily.  Marland Kitchen. aspirin-sod bicarb-citric acid (ALKA-SELTZER) 325 MG TBEF tablet Take 325 mg by mouth as needed.  Marland Kitchen. lisinopril (PRINIVIL,ZESTRIL) 10 MG tablet Take 1 tablet (10 mg total) by mouth daily.    No Known Allergies        Review of Systems     Objective:   Physical Exam  Obese, NAD HEENT:  PERRL, EOMI, discs sharp, TMs pearly gray, throat without injection. Lot of soft tissue/tonsillar tissue bilaterally  Neck:  Supple, no adenopathy, no thyromegaly.  Very tender over high cervical paraspinous musculature.  Muscles tight. Chest:  CTA CV:  RRR without murmur or rub, radial and DP pulses normal and equal Abd:  S, NT, No HSM or mass, + BS LE:  No edema       Assessment & Plan:  1.  Essential  Hypertension:  Controlled with Lisinopril and Amlopidine.  2.  Obesity:  Encouraged continued work on diet and physical activity.  Recommended working with Henreitta CeaAndy Fox with counseling regarding diabetes prevention.  Encouraged playing baseball or wiffle ball with grandchildren every afternoon as they have a good sized back yard. Concerned he may have OSA, based on body habitus and size.  Address at next visit regarding financial assistance to obtain study and if +, how to obtain equipment for treatment.  3.  Prediabetes:  As above.    4.  Chronic tension headaches:  [facility].  5.  Decreased visual acuity:  Discussed how to better use reading glasses.  Referral written--will send when patient notifies clinic of orange card receipt.

## 2017-06-15 ENCOUNTER — Ambulatory Visit: Payer: Self-pay | Admitting: Internal Medicine

## 2017-07-19 ENCOUNTER — Telehealth: Payer: Self-pay | Admitting: Internal Medicine

## 2017-07-19 NOTE — Telephone Encounter (Signed)
Called patient and left a voicemail to return call at his earliest convenience  Pt. Was a NO SHOW for appointment at the [facility]. Pt. Can always call the specialty provider and cancel the appointment  Please inform patient that when he misses appointments we schedule with specialty programs. It affects our ability to make appointments for other patients in the future and to only agree to appointments he is planning to attend or let us know  if theres is a problem getting there.

## 2017-08-23 ENCOUNTER — Other Ambulatory Visit: Payer: Self-pay

## 2017-08-24 ENCOUNTER — Ambulatory Visit: Payer: Self-pay | Admitting: Internal Medicine

## 2017-08-25 ENCOUNTER — Ambulatory Visit: Payer: Self-pay | Admitting: Internal Medicine

## 2017-09-05 ENCOUNTER — Other Ambulatory Visit: Payer: Self-pay | Admitting: Internal Medicine

## 2017-12-06 ENCOUNTER — Other Ambulatory Visit: Payer: Self-pay | Admitting: Nurse Practitioner

## 2017-12-06 ENCOUNTER — Ambulatory Visit
Admission: RE | Admit: 2017-12-06 | Discharge: 2017-12-06 | Disposition: A | Discharge status: Home / Self Care | Payer: No Typology Code available for payment source | Source: Ambulatory Visit | Attending: Nurse Practitioner | Admitting: Nurse Practitioner

## 2017-12-06 DIAGNOSIS — R52 Pain, unspecified: Secondary | ICD-10-CM

## 2018-08-29 ENCOUNTER — Encounter (HOSPITAL_COMMUNITY): Payer: Self-pay | Admitting: *Deleted

## 2018-08-29 ENCOUNTER — Emergency Department (HOSPITAL_COMMUNITY): Payer: Self-pay

## 2018-08-29 ENCOUNTER — Emergency Department (HOSPITAL_COMMUNITY)
Admission: EM | Admit: 2018-08-29 | Discharge: 2018-08-29 | Disposition: A | Discharge status: Home / Self Care | Payer: Self-pay | Attending: Emergency Medicine | Admitting: Emergency Medicine

## 2018-08-29 DIAGNOSIS — I10 Essential (primary) hypertension: Secondary | ICD-10-CM | POA: Insufficient documentation

## 2018-08-29 DIAGNOSIS — J181 Lobar pneumonia, unspecified organism: Secondary | ICD-10-CM

## 2018-08-29 DIAGNOSIS — Z79899 Other long term (current) drug therapy: Secondary | ICD-10-CM | POA: Insufficient documentation

## 2018-08-29 DIAGNOSIS — J111 Influenza due to unidentified influenza virus with other respiratory manifestations: Secondary | ICD-10-CM

## 2018-08-29 DIAGNOSIS — J1100 Influenza due to unidentified influenza virus with unspecified type of pneumonia: Secondary | ICD-10-CM | POA: Insufficient documentation

## 2018-08-29 DIAGNOSIS — F17210 Nicotine dependence, cigarettes, uncomplicated: Secondary | ICD-10-CM | POA: Insufficient documentation

## 2018-08-29 DIAGNOSIS — R69 Illness, unspecified: Secondary | ICD-10-CM

## 2018-08-29 DIAGNOSIS — J189 Pneumonia, unspecified organism: Secondary | ICD-10-CM

## 2018-08-29 LAB — CBC WITH DIFFERENTIAL/PLATELET
Abs Immature Granulocytes: 0.01 10*3/uL (ref 0.00–0.07)
Basophils Absolute: 0 10*3/uL (ref 0.0–0.1)
Basophils Relative: 1 %
Eosinophils Absolute: 0 10*3/uL (ref 0.0–0.5)
Eosinophils Relative: 0 %
HCT: 46.6 % (ref 39.0–52.0)
Hemoglobin: 15.1 g/dL (ref 13.0–17.0)
IMMATURE GRANULOCYTES: 0 %
LYMPHS PCT: 31 %
Lymphs Abs: 1.6 10*3/uL (ref 0.7–4.0)
MCH: 28.5 pg (ref 26.0–34.0)
MCHC: 32.4 g/dL (ref 30.0–36.0)
MCV: 87.9 fL (ref 80.0–100.0)
Monocytes Absolute: 0.4 10*3/uL (ref 0.1–1.0)
Monocytes Relative: 7 %
NEUTROS ABS: 3.3 10*3/uL (ref 1.7–7.7)
Neutrophils Relative %: 61 %
PLATELETS: 206 10*3/uL (ref 150–400)
RBC: 5.3 MIL/uL (ref 4.22–5.81)
RDW: 13 % (ref 11.5–15.5)
WBC: 5.4 10*3/uL (ref 4.0–10.5)
nRBC: 0 % (ref 0.0–0.2)

## 2018-08-29 LAB — BASIC METABOLIC PANEL
Anion gap: 9 (ref 5–15)
BUN: 19 mg/dL (ref 6–20)
CO2: 24 mmol/L (ref 22–32)
Calcium: 8.4 mg/dL — ABNORMAL LOW (ref 8.9–10.3)
Chloride: 102 mmol/L (ref 98–111)
Creatinine, Ser: 1.52 mg/dL — ABNORMAL HIGH (ref 0.61–1.24)
GFR calc Af Amer: >60 mL/min (ref >60)
GFR calc non Af Amer: 53 mL/min — ABNORMAL LOW (ref >60)
Glucose, Bld: 115 mg/dL — ABNORMAL HIGH (ref 70–99)
POTASSIUM: 3.3 mmol/L — AB (ref 3.5–5.1)
Sodium: 135 mmol/L (ref 135–145)

## 2018-08-29 LAB — LACTIC ACID, PLASMA: LACTIC ACID, VENOUS: 1.2 mmol/L (ref 0.5–1.9)

## 2018-08-29 MED ORDER — DOXYCYCLINE HYCLATE 100 MG PO TABS
100.0000 mg | ORAL_TABLET | Freq: Once | ORAL | Status: AC
  Administered 2018-08-29: 100 mg via ORAL
  Filled 2018-08-29: qty 1

## 2018-08-29 MED ORDER — ALBUTEROL SULFATE HFA 108 (90 BASE) MCG/ACT IN AERS
1.0000 | INHALATION_SPRAY | Freq: Once | RESPIRATORY_TRACT | Status: AC
  Administered 2018-08-29: 1 via RESPIRATORY_TRACT
  Filled 2018-08-29: qty 6.7

## 2018-08-29 MED ORDER — IPRATROPIUM-ALBUTEROL 0.5-2.5 (3) MG/3ML IN SOLN: 3.0000 mL | Freq: Once | RESPIRATORY_TRACT | Status: DC

## 2018-08-29 MED ORDER — OSELTAMIVIR PHOSPHATE 75 MG PO CAPS: 75.0000 mg | ORAL_CAPSULE | Freq: Two times a day (BID) | ORAL | 0 refills | Status: DC

## 2018-08-29 MED ORDER — OSELTAMIVIR PHOSPHATE 75 MG PO CAPS
75.0000 mg | ORAL_CAPSULE | Freq: Once | ORAL | Status: AC
  Administered 2018-08-29: 75 mg via ORAL
  Filled 2018-08-29: qty 1

## 2018-08-29 MED ORDER — POTASSIUM CHLORIDE CRYS ER 20 MEQ PO TBCR
40.0000 meq | EXTENDED_RELEASE_TABLET | Freq: Once | ORAL | Status: AC
  Administered 2018-08-29: 40 meq via ORAL
  Filled 2018-08-29: qty 2

## 2018-08-29 MED ORDER — DOXYCYCLINE HYCLATE 100 MG PO CAPS: 100.0000 mg | ORAL_CAPSULE | Freq: Two times a day (BID) | ORAL | 0 refills | Status: AC

## 2018-08-29 MED ORDER — IBUPROFEN 200 MG PO TABS
600.0000 mg | ORAL_TABLET | Freq: Once | ORAL | Status: AC
  Administered 2018-08-29: 600 mg via ORAL
  Filled 2018-08-29: qty 3

## 2018-08-29 MED ORDER — SODIUM CHLORIDE 0.9 % IV BOLUS
1000.0000 mL | Freq: Once | INTRAVENOUS | Status: AC
  Administered 2018-08-29: 1000 mL via INTRAVENOUS

## 2018-08-29 NOTE — ED Notes (Addendum)
Pt ambulated 50 feet without any assistance. SpO2 was maintained between 94-96 F. Pt assisted back into bed

## 2018-08-29 NOTE — ED Triage Notes (Addendum)
Per EMS, pt from home complains of flu-like symptoms x 5 days. Pt had temp of 104.0. Pt given tylenol en route. Pt speaks Spanish.   Pt's son states the rest of the family has been sick, the pt has been around his 2 grandchildren, who tested positive for the flu 2 days ago.  BP 152/96 HR 110 RR 16 SpO2 94

## 2018-08-29 NOTE — Discharge Instructions (Addendum)
Please see the information and instructions below regarding your visit.  Your diagnoses today include:  1. Influenza-like illness   2. Community acquired pneumonia of right upper lobe of lung (HCC)     Influenza is a viral respiratory illness that is most active in the community between November-March each Winter.  The most common symptoms in adults are fever, cough, sore throat, nasal congestion, headache, and muscle aches and pains. Some patients also get vomiting and diarrhea.   The acute phase of the illness can last 2-5 days during which you may continue to have body aches and low energy.  Tests performed today include: See side panel of your discharge paperwork for testing performed today. Vital signs are listed at the bottom of these instructions.   Medications prescribed:    Take any prescribed medications only as prescribed, and any over the counter medications only as directed on the packaging.  Tamiflu (Oseltamivir). This is an antiviral medication. We treat in patients who the Centers for Disease Control and Prevention recommends be treated to prevent secondary complications of influenza. The most common side effects include headache, nausea, and vomiting.  For fever, please take Tylenol, 650 to 1000 mg every 6 hours as needed for pain and fever.   Doxycycline is an antibiotic that fights infection in the lung. This medication can make your skin sensitive to the sun, so please ensure that you wear sunscreen, hats, or other coverage over your skin while taking this. This medicine CANNOT be taken by women while pregnant, breastfeeding, or trying to become pregnant.  Please speak with a healthcare provider if any of these situations apply to you.   Home care instructions:  Please follow any educational materials contained in this packet.   Your illness is contagious and can be spread to others, especially during the first 3 or 4 days. It cannot be cured by antibiotics or other  medicines. Take basic precautions such as washing your hands often, covering your mouth when you cough or sneeze, and avoiding public places where you could spread your illness to others.   Do not return to work, school, or other scheduled activities until you are without fever for 24 hours and not requiring ibuprofen (Advil) or acetaminophen (Tylenol) to reduce your fever.  Please continue drinking plenty of fluids.  Use over-the-counter medicines as needed as directed on packaging for symptom relief.  You may also use ibuprofen or tylenol as directed on packaging for pain or fever.  Do not take multiple medicines containing Tylenol or acetaminophen to avoid taking too much of this medication.  Follow-up instructions: Please follow-up with your primary care provider in 48-72 for further evaluation of your symptoms if they are not completely improved.   Return instructions:  Please return to the Emergency Department if you experience worsening symptoms.  RETURN IMMEDIATELY IF you develop shortness of breath, chest pain, confusion or altered mental status, a new rash, become dizzy, faint, or poorly responsive, or are unable to be cared for at home. Some patients develop bacterial pneumonia as a result of influenza. Signs and symptoms include return of fever, increased production of green or yellow phlegm, chest pain, shortness of breath. This most commonly occurs 4-5 days after your initial infection. You need to be reevaluated if you experience these symptoms. Please return if you have persistent vomiting and cannot keep down fluids or develop a fever that is not controlled by tylenol or motrin.   Please return if you have any other emergent  concerns.   Additional Information:   Your vital signs today were: BP (!) 148/81    Pulse 74    Temp 99.4 F (37.4 C) (Oral)    Resp 19    SpO2 94%  If your blood pressure (BP) was elevated on multiple readings during this visit above 130 for the top  number or above 80 for the bottom number, please have this repeated by your primary care provider within one month. --------------  Thank you for allowing [place] to participate in your care today.

## 2018-08-29 NOTE — ED Notes (Signed)
Patient transported to X-ray 

## 2018-08-29 NOTE — ED Notes (Signed)
Pt verbalized discharge instructions and follow up care. Alert and ambulatory. Leaving with son. Both verbalized importance of infection control

## 2018-08-29 NOTE — ED Provider Notes (Signed)
[facility]-EMERGENCY DEPT Provider Note   CSN: [id number] Arrival date & time: 08/29/18  1516    History   Chief Complaint Chief Complaint  Patient presents with  . Generalized Body Aches  . Cough  . Fever    HPI Austin Moreno is a 48 y.o. male.     HPI  Patient is a 48 year old male with a history of hypertension, prediabetes, obesity, hyperlipidemia and smoking presenting for fever, cough, congestion, rhinorrhea.  Patient presents with his son who he requested his interpreter.  Patient reports that 5 days ago he began having fevers at home.  He reports that he has had generalized myalgias.  He is also had nonproductive cough and soreness in his throat with coughing.  He also reports some blurred vision and dizziness when he coughs that resolves when the coughing is finished.  Patient reports he is been taking Tylenol at home for his fever.  Patient reports that when he was at work today he felt too weak to continue going through the day so he went home.  His son found him at home laying down stating that he felt too weak to get up.  Patient has had 2+ cases of influenza in the family in grandchildren.  They had laboratory confirmed influenza.  Past Medical History:  Diagnosis Date  . Allergy    Has a nurse friend who gives him a shot every year for his allergies--sounds like corticosteroids  . Hyperlipidemia    With good HDL  . Hypertension   . Obesity (BMI 30-39.9) 2016  . Prediabetes     Patient Active Problem List   Diagnosis Date Noted  . Obesity 08/16/2016  . Prediabetes 12/03/2015  . Hypertension   . Hyperlipidemia   . Allergy     History reviewed. No pertinent surgical history.      Home Medications    Prior to Admission medications   Medication Sig Start Date End Date Taking? Authorizing Provider  acetaminophen (TYLENOL) 500 MG tablet Take 1,000 mg by mouth daily as needed for moderate pain.   Yes [provider]    amLODipine (NORVASC) 10 MG tablet TAKE 1 TABLET BY MOUTH EVERY DAY 09/06/17  Yes Julieanne Manson, MD  hydrALAZINE (APRESOLINE) 10 MG tablet Take 10 mg by mouth 2 (two) times daily. 08/16/18  Yes [provider]  lisinopril (PRINIVIL,ZESTRIL) 10 MG tablet Take 1 tablet (10 mg total) by mouth daily. 11/10/16  Yes Julieanne Manson, MD  mometasone (NASONEX) 50 MCG/ACT nasal spray 2 sprays each nostril once daily Patient not taking: Reported on 08/29/2018 11/10/16   Julieanne Manson, MD    Family History Family History  Problem Relation Age of Onset  . Heart disease Mother        CAD  . Heart disease Brother 26       one brother with heart valve problems    Social History Social History   Tobacco Use  . Smoking status: Light Tobacco Smoker    Years: 32.00    Types: Cigarettes    Last attempt to quit: 07/05/2015    Years since quitting: 3.1  . Smokeless tobacco: Never Used  Substance Use Topics  . Alcohol use: No    Alcohol/week: 0.0 standard drinks    Comment: History of alcohol abuse.  Stopped 07/2014.Still going to [employer]  . 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   Review of Systems Review of Systems  Constitutional: Positive for appetite change, chills and fever.  HENT: Positive for congestion, rhinorrhea, sinus pain and sore throat.   Eyes: Negative for visual disturbance.  Respiratory: Positive for cough. Negative for chest tightness and shortness of breath.   Cardiovascular: Negative for chest pain, palpitations and leg swelling.  Gastrointestinal: Negative for abdominal pain, nausea and vomiting.  Genitourinary: Negative for dysuria and flank pain.  Musculoskeletal: Negative for back pain and myalgias.  Skin: Negative for rash.  Neurological: Negative for dizziness, syncope, light-headedness and headaches.     Physical Exam Updated Vital Signs BP 133/83   Pulse 86   Temp  (!) 102 F (38.9 C) (Oral)   Resp 18   SpO2 96%   Physical Exam Vitals signs and nursing note reviewed.  Constitutional:      General: He is not in acute distress.    Appearance: He is well-developed.  HENT:     Head: Normocephalic and atraumatic.  Eyes:     Conjunctiva/sclera: Conjunctivae normal.     Pupils: Pupils are equal, round, and reactive to light.  Neck:     Musculoskeletal: Normal range of motion and neck supple.  Cardiovascular:     Rate and Rhythm: Normal rate and regular rhythm.     Heart sounds: S1 normal and S2 normal. No murmur.  Pulmonary:     Effort: Pulmonary effort is normal.     Breath sounds: Wheezing and rales present.     Comments: End expiratory wheezes in the right upper lobe.  Rales auscultated. Abdominal:     General: There is no distension.     Palpations: Abdomen is soft.     Tenderness: There is no abdominal tenderness. There is no guarding.  Musculoskeletal: Normal range of motion.        General: No deformity.  Lymphadenopathy:     Cervical: No cervical adenopathy.  Skin:    General: Skin is warm and dry.     Findings: No erythema or rash.  Neurological:     Mental Status: He is alert.     Comments: Cranial nerves grossly intact. Patient moves extremities symmetrically and with good coordination.  Psychiatric:        Behavior: Behavior normal.        Thought Content: Thought content normal.        Judgment: Judgment normal.      ED Treatments / Results  Labs (all labs ordered are listed, but only abnormal results are displayed) Labs Reviewed  BASIC METABOLIC PANEL - Abnormal; Notable for the following components:      Result Value   Potassium 3.3 (*)    Glucose, Bld 115 (*)    Creatinine, Ser 1.52 (*)    Calcium 8.4 (*)    GFR calc non Af Amer 53 (*)    All other components within normal limits  CBC WITH DIFFERENTIAL/PLATELET  LACTIC ACID, PLASMA    EKG None  Radiology Dg Chest 2 View  Result Date:  08/29/2018 CLINICAL DATA:  Flu-like symptoms for 5 days. Fever. Current smoker. EXAM: CHEST - 2 VIEW COMPARISON:  12/06/2017 FINDINGS: Normal heart size and pulmonary vascularity. Suggestion of patchy infiltration in the right upper lung, possibly indicating pneumonia. Central peribronchial thickening likely representing chronic bronchitis. Acute bronchitis also possible. Degenerative changes in the spine and shoulders. IMPRESSION: Patchy infiltration in the right upper lung suggesting pneumonia. Chronic bronchitic changes. Electronically Signed   By: Chrissie Noa  Andria Meuse M.D.   On: 08/29/2018 20:19    Procedures Procedures (including critical care time)  Medications Ordered in ED Medications  ipratropium-albuterol (DUONEB) 0.5-2.5 (3) MG/3ML nebulizer solution 3 mL (has no administration in time range)  ibuprofen (ADVIL,MOTRIN) tablet 600 mg (600 mg Oral Given 08/29/18 1854)  sodium chloride 0.9 % bolus 1,000 mL (0 mLs Intravenous Stopped 08/29/18 1943)     Initial Impression / Assessment and Plan / ED Course  I have reviewed the triage vital signs and the nursing notes.  Pertinent labs & imaging results that were available during my care of the patient were reviewed by me and considered in my medical decision making (see chart for details).  Clinical Course as of Aug 30 2023  Wed Aug 29, 2018  2024 Creatinine(!): 1.52 [AM]    Clinical Course User Index [AM] Elisha Ponder, PA-C       Patient nontoxic-appearing and in no acute distress.  Initially febrile on arrival to 102.  This improved with antipyretics.  Suspect influenza given multiple recent sick contacts with flu.  Given abnormal lung exam, will assess for concomitant pneumonia.  Work-up demonstrating slight AKI.  See below for trend of creatinine.  No leukocytosis. Slight hypokalemia at 3.3, repleted.  Lab Results  Component Value Date   CREATININE 1.52 (H) 08/29/2018   CREATININE 1.00 11/29/2016   CREATININE 0.96 08/16/2016    Patient has patchy infiltrate in the right upper lobe.  Suspect pneumonia.  Will treat with Tamiflu and doxycycline for possible post viral pneumonia.   Patient made hemodynamically stable in the emergency department.  Tachycardia and fever resolved in the diuretics.  Patient received 1 L normal saline for AKI.  He ambulated in the emergency department with no desaturation.  Return precautions given for any increased work of breathing, chest pain, dizziness, lightheadedness, syncope or presyncope.  Patient is in understanding and agrees with the plan of care.  This is a supervised visit with Dr. Derwood Kaplan. Evaluation, management, and discharge planning discussed with this attending physician.  Final Clinical Impressions(s) / ED Diagnoses   Final diagnoses:  Influenza-like illness  Community acquired pneumonia of right upper lobe of lung [facility])    ED Discharge Orders         Ordered    doxycycline (VIBRAMYCIN) 100 MG capsule  2 times daily     08/29/18 2150    oseltamivir (TAMIFLU) 75 MG capsule  Every 12 hours     08/29/18 2150           Elisha Ponder, PA-C 08/30/18 0001    Derwood Kaplan, MD 08/30/18 [phone]

## 2020-08-30 IMAGING — CR DG CHEST 2V
2 series · 2 of 2 positions shown · non-contrast
Comparison: 12/06/2017

CLINICAL DATA: Flu-like symptoms for 5 days. Fever. Current smoker.

EXAM:
CHEST - 2 VIEW

[w chest pa]
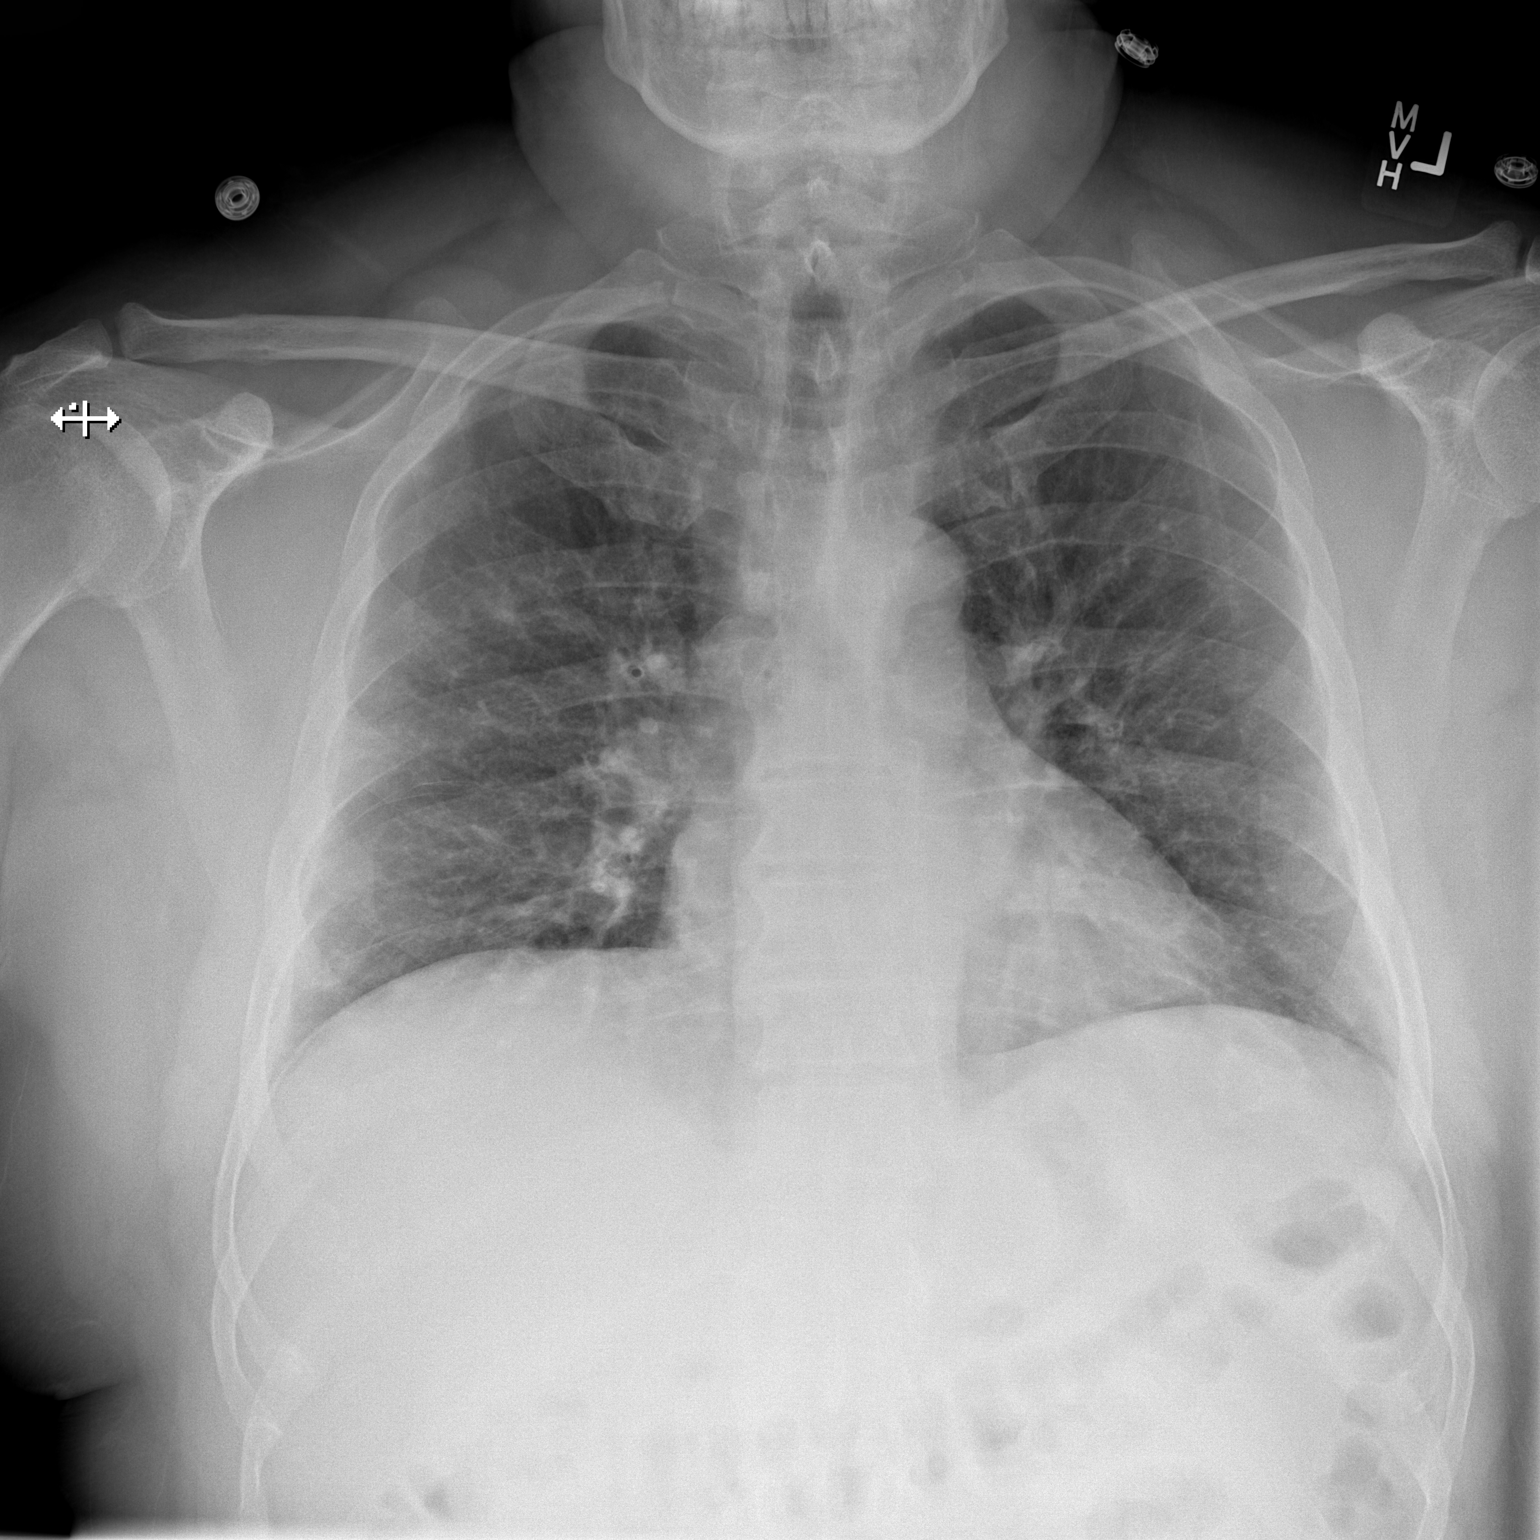

[w chest lat]
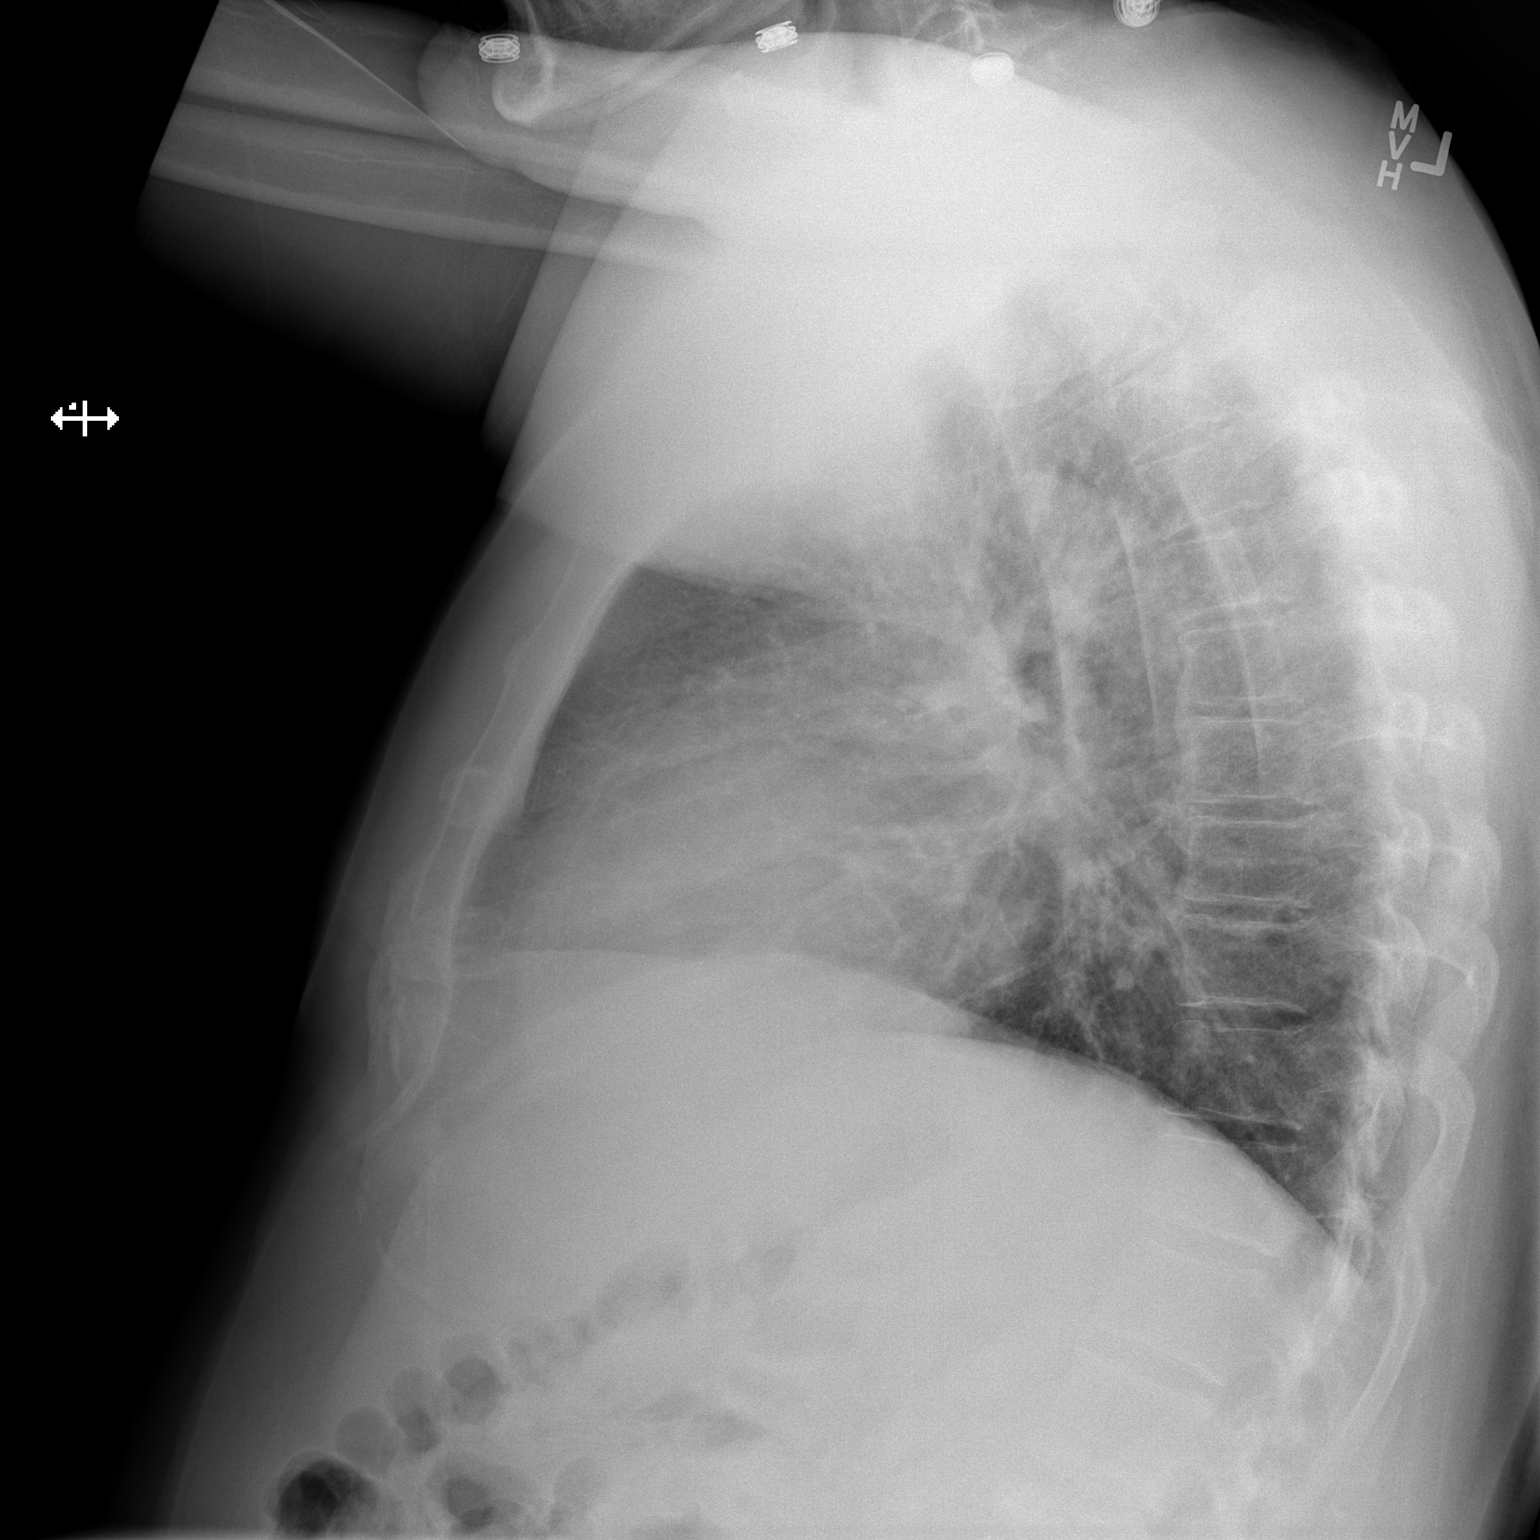

[2 of 2 positions shown; findings below may reference images not displayed]

FINDINGS: Normal heart size and pulmonary vascularity. Suggestion of patchy
infiltration in the right upper lung, possibly indicating pneumonia.
Central peribronchial thickening likely representing chronic
bronchitis. Acute bronchitis also possible. Degenerative changes in
the spine and shoulders.
IMPRESSION: Patchy infiltration in the right upper lung suggesting pneumonia.
Chronic bronchitic changes.

## 2022-05-18 ENCOUNTER — Other Ambulatory Visit: Payer: Self-pay

## 2022-05-18 ENCOUNTER — Emergency Department (HOSPITAL_COMMUNITY): Payer: 59

## 2022-05-18 ENCOUNTER — Encounter (HOSPITAL_COMMUNITY): Payer: Self-pay | Admitting: Emergency Medicine

## 2022-05-18 ENCOUNTER — Inpatient Hospital Stay (HOSPITAL_COMMUNITY)
Admission: EM | Admit: 2022-05-18 | Discharge: 2022-05-21 | DRG: 304 | Disposition: A | Discharge status: Home / Self Care | Payer: 59 | Attending: Internal Medicine | Admitting: Internal Medicine

## 2022-05-18 DIAGNOSIS — Z1152 Encounter for screening for COVID-19: Secondary | ICD-10-CM

## 2022-05-18 DIAGNOSIS — E785 Hyperlipidemia, unspecified: Secondary | ICD-10-CM | POA: Diagnosis not present

## 2022-05-18 DIAGNOSIS — I161 Hypertensive emergency: Principal | ICD-10-CM | POA: Diagnosis present

## 2022-05-18 DIAGNOSIS — R7989 Other specified abnormal findings of blood chemistry: Secondary | ICD-10-CM

## 2022-05-18 DIAGNOSIS — K7460 Unspecified cirrhosis of liver: Secondary | ICD-10-CM | POA: Diagnosis present

## 2022-05-18 DIAGNOSIS — F17210 Nicotine dependence, cigarettes, uncomplicated: Secondary | ICD-10-CM | POA: Diagnosis present

## 2022-05-18 DIAGNOSIS — K7031 Alcoholic cirrhosis of liver with ascites: Secondary | ICD-10-CM | POA: Insufficient documentation

## 2022-05-18 DIAGNOSIS — Z6830 Body mass index (BMI) 30.0-30.9, adult: Secondary | ICD-10-CM

## 2022-05-18 DIAGNOSIS — R079 Chest pain, unspecified: Secondary | ICD-10-CM | POA: Diagnosis present

## 2022-05-18 DIAGNOSIS — N179 Acute kidney failure, unspecified: Secondary | ICD-10-CM | POA: Diagnosis not present

## 2022-05-18 DIAGNOSIS — I110 Hypertensive heart disease with heart failure: Secondary | ICD-10-CM | POA: Diagnosis present

## 2022-05-18 DIAGNOSIS — Z23 Encounter for immunization: Secondary | ICD-10-CM

## 2022-05-18 DIAGNOSIS — I160 Hypertensive urgency: Principal | ICD-10-CM

## 2022-05-18 DIAGNOSIS — R809 Proteinuria, unspecified: Secondary | ICD-10-CM | POA: Diagnosis present

## 2022-05-18 DIAGNOSIS — R778 Other specified abnormalities of plasma proteins: Secondary | ICD-10-CM | POA: Diagnosis not present

## 2022-05-18 DIAGNOSIS — I472 Ventricular tachycardia, unspecified: Secondary | ICD-10-CM | POA: Diagnosis not present

## 2022-05-18 DIAGNOSIS — R7303 Prediabetes: Secondary | ICD-10-CM | POA: Diagnosis present

## 2022-05-18 DIAGNOSIS — T502X5A Adverse effect of carbonic-anhydrase inhibitors, benzothiadiazides and other diuretics, initial encounter: Secondary | ICD-10-CM | POA: Diagnosis not present

## 2022-05-18 DIAGNOSIS — Z8249 Family history of ischemic heart disease and other diseases of the circulatory system: Secondary | ICD-10-CM

## 2022-05-18 DIAGNOSIS — E669 Obesity, unspecified: Secondary | ICD-10-CM | POA: Diagnosis present

## 2022-05-18 DIAGNOSIS — I700 Atherosclerosis of aorta: Secondary | ICD-10-CM | POA: Diagnosis present

## 2022-05-18 DIAGNOSIS — E876 Hypokalemia: Secondary | ICD-10-CM | POA: Diagnosis not present

## 2022-05-18 DIAGNOSIS — I5031 Acute diastolic (congestive) heart failure: Secondary | ICD-10-CM | POA: Diagnosis present

## 2022-05-18 DIAGNOSIS — I2489 Other forms of acute ischemic heart disease: Secondary | ICD-10-CM | POA: Diagnosis present

## 2022-05-18 DIAGNOSIS — Z91148 Patient's other noncompliance with medication regimen for other reason: Secondary | ICD-10-CM

## 2022-05-18 DIAGNOSIS — F1090 Alcohol use, unspecified, uncomplicated: Secondary | ICD-10-CM | POA: Diagnosis present

## 2022-05-18 DIAGNOSIS — Z79899 Other long term (current) drug therapy: Secondary | ICD-10-CM

## 2022-05-18 LAB — TROPONIN I (HIGH SENSITIVITY)
Troponin I (High Sensitivity): 102 ng/L (ref <18)
Troponin I (High Sensitivity): 101 ng/L (ref <18)

## 2022-05-18 LAB — URINALYSIS, ROUTINE W REFLEX MICROSCOPIC
Bacteria, UA: NONE SEEN
Bilirubin Urine: NEGATIVE
Glucose, UA: NEGATIVE mg/dL
Hgb urine dipstick: NEGATIVE
Ketones, ur: NEGATIVE mg/dL
Leukocytes,Ua: NEGATIVE
Nitrite: NEGATIVE
Protein, ur: >=300 mg/dL — AB
Specific Gravity, Urine: 1.016 (ref 1.005–1.030)
pH: 7 (ref 5.0–8.0)

## 2022-05-18 LAB — CBC WITH DIFFERENTIAL/PLATELET
Abs Immature Granulocytes: 0.04 10*3/uL (ref 0.00–0.07)
Basophils Absolute: 0.1 10*3/uL (ref 0.0–0.1)
Basophils Relative: 1 %
Eosinophils Absolute: 0.1 10*3/uL (ref 0.0–0.5)
Eosinophils Relative: 1 %
HCT: 37.8 % — ABNORMAL LOW (ref 39.0–52.0)
Hemoglobin: 13.1 g/dL (ref 13.0–17.0)
Immature Granulocytes: 0 %
Lymphocytes Relative: 18 %
Lymphs Abs: 1.8 10*3/uL (ref 0.7–4.0)
MCH: 30 pg (ref 26.0–34.0)
MCHC: 34.7 g/dL (ref 30.0–36.0)
MCV: 86.7 fL (ref 80.0–100.0)
Monocytes Absolute: 0.7 10*3/uL (ref 0.1–1.0)
Monocytes Relative: 7 %
Neutro Abs: 7.1 10*3/uL (ref 1.7–7.7)
Neutrophils Relative %: 73 %
Platelets: 264 10*3/uL (ref 150–400)
RBC: 4.36 MIL/uL (ref 4.22–5.81)
RDW: 13.5 % (ref 11.5–15.5)
WBC: 9.8 10*3/uL (ref 4.0–10.5)
nRBC: 0 % (ref 0.0–0.2)

## 2022-05-18 LAB — COMPREHENSIVE METABOLIC PANEL
ALT: 46 U/L — ABNORMAL HIGH (ref 0–44)
AST: 39 U/L (ref 15–41)
Albumin: 3.3 g/dL — ABNORMAL LOW (ref 3.5–5.0)
Alkaline Phosphatase: 67 U/L (ref 38–126)
Anion gap: 11 (ref 5–15)
BUN: 8 mg/dL (ref 6–20)
CO2: 26 mmol/L (ref 22–32)
Calcium: 8.4 mg/dL — ABNORMAL LOW (ref 8.9–10.3)
Chloride: 99 mmol/L (ref 98–111)
Creatinine, Ser: 1.22 mg/dL (ref 0.61–1.24)
GFR, Estimated: >60 mL/min (ref >60)
Glucose, Bld: 122 mg/dL — ABNORMAL HIGH (ref 70–99)
Potassium: 3 mmol/L — ABNORMAL LOW (ref 3.5–5.1)
Sodium: 136 mmol/L (ref 135–145)
Total Bilirubin: 0.9 mg/dL (ref 0.3–1.2)
Total Protein: 7.4 g/dL (ref 6.5–8.1)

## 2022-05-18 LAB — CBC
HCT: 36.9 % — ABNORMAL LOW (ref 39.0–52.0)
Hemoglobin: 12.7 g/dL — ABNORMAL LOW (ref 13.0–17.0)
MCH: 29.7 pg (ref 26.0–34.0)
MCHC: 34.4 g/dL (ref 30.0–36.0)
MCV: 86.4 fL (ref 80.0–100.0)
Platelets: 293 10*3/uL (ref 150–400)
RBC: 4.27 MIL/uL (ref 4.22–5.81)
RDW: 13.6 % (ref 11.5–15.5)
WBC: 10.7 10*3/uL — ABNORMAL HIGH (ref 4.0–10.5)
nRBC: 0 % (ref 0.0–0.2)

## 2022-05-18 LAB — RESP PANEL BY RT-PCR (FLU A&B, COVID) ARPGX2
Influenza A by PCR: NEGATIVE
Influenza B by PCR: NEGATIVE
SARS Coronavirus 2 by RT PCR: NEGATIVE

## 2022-05-18 LAB — BRAIN NATRIURETIC PEPTIDE: B Natriuretic Peptide: 1192 pg/mL — ABNORMAL HIGH (ref 0.0–100.0)

## 2022-05-18 LAB — LIPASE, BLOOD: Lipase: 26 U/L (ref 11–51)

## 2022-05-18 MED ORDER — ACETAMINOPHEN 650 MG RE SUPP: 650.0000 mg | Freq: Four times a day (QID) | RECTAL | Status: DC | PRN

## 2022-05-18 MED ORDER — FUROSEMIDE 10 MG/ML IJ SOLN
20.0000 mg | Freq: Once | INTRAMUSCULAR | Status: AC
  Administered 2022-05-19: 20 mg via INTRAVENOUS
  Filled 2022-05-18: qty 2

## 2022-05-18 MED ORDER — HYDROCHLOROTHIAZIDE 25 MG PO TABS
25.0000 mg | ORAL_TABLET | Freq: Every day | ORAL | Status: DC
  Administered 2022-05-19: 25 mg via ORAL
  Filled 2022-05-18: qty 1

## 2022-05-18 MED ORDER — LISINOPRIL 10 MG PO TABS
10.0000 mg | ORAL_TABLET | Freq: Every day | ORAL | Status: DC
  Administered 2022-05-18 – 2022-05-19 (×2): 10 mg via ORAL
  Filled 2022-05-18 (×2): qty 1

## 2022-05-18 MED ORDER — LIDOCAINE VISCOUS HCL 2 % MT SOLN
15.0000 mL | Freq: Once | OROMUCOSAL | Status: AC
  Administered 2022-05-18: 15 mL via ORAL
  Filled 2022-05-18: qty 15

## 2022-05-18 MED ORDER — METOPROLOL TARTRATE 5 MG/5ML IV SOLN: 5.0000 mg | Freq: Four times a day (QID) | INTRAVENOUS | Status: DC | PRN

## 2022-05-18 MED ORDER — SENNOSIDES-DOCUSATE SODIUM 8.6-50 MG PO TABS: 1.0000 | ORAL_TABLET | Freq: Every evening | ORAL | Status: DC | PRN

## 2022-05-18 MED ORDER — ALUM & MAG HYDROXIDE-SIMETH 200-200-20 MG/5ML PO SUSP
30.0000 mL | Freq: Once | ORAL | Status: AC
  Administered 2022-05-18: 30 mL via ORAL
  Filled 2022-05-18: qty 30

## 2022-05-18 MED ORDER — IOHEXOL 350 MG/ML SOLN
75.0000 mL | Freq: Once | INTRAVENOUS | Status: AC | PRN
  Administered 2022-05-18: 75 mL via INTRAVENOUS

## 2022-05-18 MED ORDER — AMLODIPINE BESYLATE 5 MG PO TABS
10.0000 mg | ORAL_TABLET | Freq: Once | ORAL | Status: AC
  Administered 2022-05-18: 10 mg via ORAL
  Filled 2022-05-18: qty 2

## 2022-05-18 MED ORDER — LABETALOL HCL 5 MG/ML IV SOLN
10.0000 mg | Freq: Once | INTRAVENOUS | Status: AC
  Administered 2022-05-18: 10 mg via INTRAVENOUS
  Filled 2022-05-18: qty 4

## 2022-05-18 MED ORDER — HYDRALAZINE HCL 10 MG PO TABS
10.0000 mg | ORAL_TABLET | Freq: Once | ORAL | Status: AC
  Administered 2022-05-18: 10 mg via ORAL
  Filled 2022-05-18: qty 1

## 2022-05-18 MED ORDER — ONDANSETRON HCL 4 MG PO TABS: 4.0000 mg | ORAL_TABLET | Freq: Four times a day (QID) | ORAL | Status: DC | PRN

## 2022-05-18 MED ORDER — ASPIRIN 81 MG PO TBEC
81.0000 mg | DELAYED_RELEASE_TABLET | Freq: Every day | ORAL | Status: DC
  Administered 2022-05-19 – 2022-05-21 (×3): 81 mg via ORAL
  Filled 2022-05-18 (×3): qty 1

## 2022-05-18 MED ORDER — ONDANSETRON HCL 4 MG/2ML IJ SOLN: 4.0000 mg | Freq: Four times a day (QID) | INTRAMUSCULAR | Status: DC | PRN

## 2022-05-18 MED ORDER — ACETAMINOPHEN 325 MG PO TABS: 650.0000 mg | ORAL_TABLET | Freq: Four times a day (QID) | ORAL | Status: DC | PRN

## 2022-05-18 MED ORDER — ENOXAPARIN SODIUM 40 MG/0.4ML IJ SOSY
40.0000 mg | PREFILLED_SYRINGE | INTRAMUSCULAR | Status: DC
  Administered 2022-05-19 – 2022-05-20 (×2): 40 mg via SUBCUTANEOUS
  Filled 2022-05-18 (×2): qty 0.4

## 2022-05-18 MED ORDER — HYDRALAZINE HCL 20 MG/ML IJ SOLN
10.0000 mg | Freq: Once | INTRAMUSCULAR | Status: AC
  Administered 2022-05-18: 10 mg via INTRAVENOUS
  Filled 2022-05-18: qty 1

## 2022-05-18 NOTE — ED Notes (Signed)
Spoke to interpreter [phone]

## 2022-05-18 NOTE — ED Notes (Signed)
Patient ambulated to the bathroom with a steady gait

## 2022-05-18 NOTE — ED Notes (Signed)
Notified Sage PA about pt BP

## 2022-05-18 NOTE — ED Triage Notes (Addendum)
Pt/translator stated, My head and stomach hurt since yesterday. Denies any other symptoms Pt has hypertension and he stopped taking the medication years ago because it made me feel bad.

## 2022-05-18 NOTE — Consult Note (Signed)
Cardiology Consultation   Patient ID: Austin Moreno MRN: [id number]; DOB: 1971/06/09  Admit date: 05/18/2022 Date of Consult: 05/18/2022  PCP:  Austin Moreno, [place] Providers Cardiologist:  None        Patient Profile:   Austin Moreno is a 51 y.o. male with a hx of hypertension, hyperlipidemia who is being seen 05/18/2022 for the evaluation of chest pain and hypertension at the request of Austin Moreno.  History of Present Illness:   Austin Moreno reports a history of treated hypertension until two years ago when he self-discontinued his anti-hypertensive therapies due to symptoms of fatigue and presyncope. He felt well until the past week when he had an episode of "agitation" and flushing while exerting himself at work that self resolved with rest.  He then again developed epigastric pain with associated dyspnea yesterday and was evaluated by his PCP.  He was instructed to present to the emergency department due to concern for acute coronary syndrome and was given a dose of aspirin and nitroglycerin.  The patient reported that he would self present later in the day and left against medical advice.  His blood pressure in clinic was 200/102 with a heart rate of 101.  Upon arrival to our emergency department his blood pressure was 237/139 with a heart rate of 101.  He received 10 mg of amlodipine, 2 doses of 10 mg of IV hydralazine, 10 mg of IV labetalol, and 10 mg of lisinopril.  After these interventions his blood pressure is now 144/99.  His chest pain had resolved at the time of my evaluation.  Past Medical History:  Diagnosis Date   Allergy    Has a nurse friend who gives him a shot every year for his allergies--sounds like corticosteroids   Hyperlipidemia    With good HDL   Hypertension    Obesity (BMI 30-39.9) 2016   Prediabetes     History reviewed. No pertinent surgical history.   Not taking any home medications.  Inpatient  Medications: Scheduled Meds:  [START ON 05/19/2022] aspirin EC  81 mg Oral Daily   [START ON 05/19/2022] enoxaparin (LOVENOX) injection  40 mg Subcutaneous Q24H   [START ON 05/19/2022] hydrochlorothiazide  25 mg Oral Daily   lisinopril  10 mg Oral Daily   Continuous Infusions:  PRN Meds: acetaminophen **OR** acetaminophen, metoprolol tartrate, ondansetron **OR** ondansetron (ZOFRAN) IV, senna-docusate  Allergies:   No Known Allergies  Social History:   Social History   Socioeconomic History   Marital status: Married    Spouse name: Not on file   Number of children: 3   Years of education: 0   Highest education level: Not on file  Occupational History   Occupation: Agricultural engineer  Tobacco Use   Smoking status: Light Smoker    Years: 32.00    Types: Cigarettes    Last attempt to quit: 07/05/2015    Years since quitting: 6.8   Smokeless tobacco: Never  Substance and Sexual Activity   Alcohol use: Yes    Comment: History of alcohol abuse.  Stopped 07/2014.Still going to [employer].   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   Moved in 1989 to [place].   Lives with wife and 58 yo son   Social Determinants of Health  Financial Resource Strain: Not on file  Food Insecurity: Not on file  Transportation Needs: Not on file  Physical Activity: Not on file  Stress: Not on file  Social Connections: Not on file  Intimate Partner Violence: Not on file    Family History:    Family History  Problem Relation Age of Onset   Heart disease Mother        CAD   Heart disease Brother 11       one brother with heart valve problems     ROS:  Please see the history of present illness.   All other ROS reviewed and negative.     Physical Exam/Data:   Vitals:   05/18/22 2100 05/18/22 2208 05/18/22 2209 05/18/22 2300  BP:  (!) 157/96 (!) 155/100  (!) 144/99  Pulse: 89 87 88 89  Resp: 20 20 19  (!) 23  Temp:      TempSrc:      SpO2: 98% 99% 99% 99%   No intake or output data in the 24 hours ending 05/18/22 2330    02/09/2017    8:31 AM 11/10/2016    8:41 AM 08/16/2016    8:27 AM  Last 3 Weights  Weight (lbs) 206 lb 204 lb 217 lb  Weight (kg) 93.441 kg 92.534 kg 98.431 kg     There is no height or weight on file to calculate BMI.  General:  Well nourished, well developed, in no acute distress HEENT: normal Neck: no JVD Vascular: No carotid bruits; Distal pulses 2+ bilaterally Cardiac:  normal S1, S2; RRR; no murmur  Lungs:  clear to auscultation bilaterally, no wheezing, rhonchi or rales  Abd: soft, nontender, no hepatomegaly  Ext: trace right lower extremity edema Musculoskeletal:  No deformities, BUE and BLE strength normal and equal Skin: warm and dry  Neuro:  CNs 2-12 intact, no focal abnormalities noted Psych:  Normal affect   EKG:  The EKG was personally reviewed and demonstrates:  Normal sinus rhythm, RAD, LVH, LAE  Relevant CV Studies: None  Laboratory Data:  High Sensitivity Troponin:   Recent Labs  Lab 05/18/22 1700 05/18/22 1910  TROPONINIHS 102* 101*     Chemistry Recent Labs  Lab 05/18/22 1144  NA 136  K 3.0*  CL 99  CO2 26  GLUCOSE 122*  BUN 8  CREATININE 1.22  CALCIUM 8.4*  GFRNONAA >60  ANIONGAP 11    Recent Labs  Lab 05/18/22 1144  PROT 7.4  ALBUMIN 3.3*  AST 39  ALT 46*  ALKPHOS 67  BILITOT 0.9   Lipids No results for input(s): "CHOL", "TRIG", "HDL", "LABVLDL", "LDLCALC", "CHOLHDL" in the last 168 hours.  Hematology Recent Labs  Lab 05/18/22 1144  WBC 9.8  RBC 4.36  HGB 13.1  HCT 37.8*  MCV 86.7  MCH 30.0  MCHC 34.7  RDW 13.5  PLT 264   Thyroid No results for input(s): "TSH", "FREET4" in the last 168 hours.  BNPNo results for input(s): "BNP", "PROBNP" in the last 168 hours.  DDimer No results for input(s): "DDIMER" in the last 168  hours.   Radiology/Studies:  CT Angio Chest/Abd/Pel for Dissection W and/or W/WO  Result Date: 05/18/2022 CLINICAL DATA:  Head and stomach pain since yesterday, hypertension EXAM: CT ANGIOGRAPHY CHEST, ABDOMEN AND PELVIS TECHNIQUE: Non-contrast CT of the chest was initially obtained. Multidetector CT imaging through the chest, abdomen and pelvis was performed using the standard protocol during bolus administration of intravenous contrast. Multiplanar reconstructed images and MIPs  were obtained and reviewed to evaluate the vascular anatomy. RADIATION DOSE REDUCTION: This exam was performed according to the departmental dose-optimization program which includes automated exposure control, adjustment of the mA and/or kV according to patient size and/or use of iterative reconstruction technique. CONTRAST:  45mL OMNIPAQUE IOHEXOL 350 MG/ML SOLN COMPARISON:  08/29/2018 FINDINGS: CTA CHEST FINDINGS Cardiovascular: No evidence of thoracic aortic aneurysm or dissection. Mild cardiomegaly without pericardial effusion. Atherosclerosis of the coronary vasculature. Mediastinum/Nodes: No enlarged mediastinal, hilar, or axillary lymph nodes. Thyroid gland, trachea, and esophagus demonstrate no significant findings. Lungs/Pleura: Trace bilateral pleural effusions, right greater than left. No airspace disease or pneumothorax. Central airways are patent. Musculoskeletal: There are no acute or destructive bony lesions. Reconstructed images demonstrate no additional findings. Review of the MIP images confirms the above findings. CTA ABDOMEN AND PELVIS FINDINGS VASCULAR Aorta: Normal caliber aorta without aneurysm, dissection, vasculitis or significant stenosis. Atherosclerosis of the distal aorta. Celiac: Patent without evidence of aneurysm, dissection, vasculitis or significant stenosis. SMA: Patent without evidence of aneurysm, dissection, vasculitis or significant stenosis. Renals: Both renal arteries are patent without  evidence of aneurysm, dissection, vasculitis, fibromuscular dysplasia or significant stenosis. IMA: Patent without evidence of aneurysm, dissection, vasculitis or significant stenosis. Inflow: Patent without evidence of aneurysm, dissection, vasculitis or significant stenosis. Veins: No obvious venous abnormality within the limitations of this arterial phase study. Review of the MIP images confirms the above findings. NON-VASCULAR Hepatobiliary: Nodularity of the liver capsule and hypertrophy of the left lobe liver consistent with cirrhosis. No focal parenchymal liver abnormality. Gallbladder is unremarkable. Pancreas: Unremarkable. No pancreatic ductal dilatation or surrounding inflammatory changes. Spleen: Normal in size without focal abnormality. Adrenals/Urinary Tract: Adrenal glands are unremarkable. Kidneys are normal, without renal calculi, focal lesion, or hydronephrosis. Bladder is unremarkable. Stomach/Bowel: No bowel obstruction or ileus. Normal appendix right lower quadrant. No bowel wall thickening or inflammatory change. Lymphatic: No pathologic adenopathy. Reproductive: Prostate is unremarkable. Other: No free fluid or free intraperitoneal gas. No abdominal wall hernia. Musculoskeletal: No acute or destructive bony lesions. Reconstructed images demonstrate no additional findings. Review of the MIP images confirms the above findings. IMPRESSION: 1. No evidence of thoracoabdominal aortic aneurysm or dissection. 2. Trace bilateral pleural effusions, right greater than left. 3. Nodular liver capsule and left lobe hypertrophy compatible with cirrhosis. 4.  Aortic Atherosclerosis (ICD10-I70.0). Electronically Signed   By: Randa Ngo M.D.   On: 05/18/2022 20:39   CT Head Wo Contrast  Result Date: 05/18/2022 CLINICAL DATA:  Sudden severe headaches EXAM: CT HEAD WITHOUT CONTRAST TECHNIQUE: Contiguous axial images were obtained from the base of the skull through the vertex without intravenous contrast.  RADIATION DOSE REDUCTION: This exam was performed according to the departmental dose-optimization program which includes automated exposure control, adjustment of the mA and/or kV according to patient size and/or use of iterative reconstruction technique. COMPARISON:  10/01/2013 FINDINGS: Brain: No acute intracranial findings are seen. There are no signs of bleeding within the cranium. Ventricles are not dilated. There are multiple calcifications in both cerebral hemispheres with no significant interval change. There is no adjacent edema or mass effect. This finding may suggest presence of multiple healed granulomas or residual changes from some other inflammatory process such as cysticercosis. Vascular: Unremarkable. Skull: There is decreased in number of air cells in mastoids. No fracture is seen. Sinuses/Orbits: No acute findings are seen. Other: No significant interval changes are noted. IMPRESSION: No acute intracranial findings are seen in noncontrast CT brain. There are few scattered calcifications of varying  sizes in both cerebral hemispheres with no interval change since 10/01/2013 suggesting residual change from previous inflammation. Electronically Signed   By: Ernie Avena M.D.   On: 05/18/2022 12:23     Assessment and Plan:   Hypertensive Emergency Related to self discontinuation of medications.  Has associated troponin elevation, headache, and elevated BNP. -Recommend goal blood pressure of 160 systolic for the next few weeks to allow for adjustment of cerebral autoregulation. -Pending blood pressure tomorrow would discharge the patient on a minimum of 40 mg of lisinopril. -Echo -Lasix 20 mg IV in am  Chest Pain Overall atypical symptoms most likely related to severely elevated blood pressure.  He does have mild coronary calcifications on his CT scan which suggests some degree of coronary artery disease.  - Recommend outpatient stress test when blood pressure better  controlled  Hyperlipidemia -Lipid panel  Aortic atherosclerosis Seen on CT scan. -Lipid panel  Prediabetes -A1C   Risk Assessment/Risk Scores:          For questions or updates, please contact [facility] Please consult [email or web address] for contact info under    Signed, Roderic Palau, MD  05/18/2022 11:30 PM

## 2022-05-18 NOTE — ED Notes (Signed)
Pt moved to room 8 and placed on cardiac monitor.

## 2022-05-18 NOTE — ED Provider Triage Note (Signed)
Emergency Medicine Provider Triage Evaluation Note  Austin Moreno , a 51 y.o. male  was evaluated in triage.  Pt complains of headache starting yesterday.  Also having intermittent abdominal pain worse in the upper abdomen.  Not on any blood pressure medicine, having intermittent diarrhea but no vomiting endorses nausea..  Review of Systems  Per HPI  Physical Exam  BP (!) 212/126 (BP Location: Left Arm)   Pulse 98   Temp 99.1 F (37.3 C) (Oral)   Resp 16   SpO2 98%  Gen:   Awake, no distress   Resp:  Normal effort  MSK:   Moves extremities without difficulty  Other:  Epigastric ttp, cranial nerves II to XII grossly intact.  Upper and lower extremities and symmetric bilaterally.  Medical Decision Making  Medically screening exam initiated at 11:35 AM.  Appropriate orders placed.  Austin Moreno was informed that the remainder of the evaluation will be completed by another provider, this initial triage assessment does not replace that evaluation, and the importance of remaining in the ED until their evaluation is complete.     Theron Arista, PA-C 05/18/22 1136

## 2022-05-18 NOTE — ED Provider Notes (Signed)
[facility] EMERGENCY DEPARTMENT Provider Note   CSN: [id number] Arrival date & time: 05/18/22  1004     History  Chief Complaint  Patient presents with   Abdominal Pain   Headache    Austin Moreno is a 51 y.o. male.  The history is provided by the patient, medical records and the spouse. The history is limited by a language barrier. A language interpreter was used.  Abdominal Pain Headache Associated symptoms: abdominal pain      Patient with medical history of medical noncompliance, hypertension, hyperlipidemia, prediabetes presenting today due to headache and epigastric abdominal pain.  Patient states has been off his blood pressure medicine for about a year.  He was seen by his primary yesterday due to intermittent chest pain for 2 days.  He states he has no chest pain today, he does endorse epigastric tenderness associated with nausea, diarrhea.  No actual emesis.  The headache is constant, no vision changes or neck pain.  He denies any pain in his back, dysuria, hematuria.  No penile discharge and he is married and significant other.  Home Medications Prior to Admission medications   Medication Sig Start Date End Date Taking? Authorizing Provider  acetaminophen (TYLENOL) 500 MG tablet Take 1,000 mg by mouth daily as needed for moderate pain.    [provider]  amLODipine (NORVASC) 10 MG tablet TAKE 1 TABLET BY MOUTH EVERY DAY 09/06/17   Julieanne Manson, MD  hydrALAZINE (APRESOLINE) 10 MG tablet Take 10 mg by mouth 2 (two) times daily. 08/16/18   [provider]  lisinopril (PRINIVIL,ZESTRIL) 10 MG tablet Take 1 tablet (10 mg total) by mouth daily. 11/10/16   Julieanne Manson, MD  mometasone (NASONEX) 50 MCG/ACT nasal spray 2 sprays each nostril once daily Patient not taking: Reported on 08/29/2018 11/10/16   Julieanne Manson, MD  oseltamivir (TAMIFLU) 75 MG capsule Take 1 capsule (75 mg total) by mouth every 12 (twelve) hours.  08/29/18   Aviva Kluver B, PA-C      Allergies    Patient has no known allergies.    Review of Systems   Review of Systems  Gastrointestinal:  Positive for abdominal pain.  Neurological:  Positive for headaches.    Physical Exam Updated Vital Signs BP (!) 172/118 (BP Location: Left Arm)   Pulse (!) 104   Temp 98.9 F (37.2 C) (Oral)   Resp 17   SpO2 97%  Physical Exam Vitals and nursing note reviewed. Exam conducted with a chaperone present.  Constitutional:      Appearance: Normal appearance.  HENT:     Head: Normocephalic and atraumatic.  Eyes:     General: No scleral icterus.       Right eye: No discharge.        Left eye: No discharge.     Extraocular Movements: Extraocular movements intact.     Pupils: Pupils are equal, round, and reactive to light.  Cardiovascular:     Rate and Rhythm: Normal rate and regular rhythm.     Pulses: Normal pulses.     Heart sounds: Normal heart sounds. No murmur heard.    No friction rub. No gallop.     Comments: Upper and lower extremity pulses are symmetric bilaterally Pulmonary:     Effort: Pulmonary effort is normal. No respiratory distress.     Breath sounds: Normal breath sounds.  Abdominal:     General: Abdomen is flat. Bowel sounds are normal. There is no distension.  Palpations: Abdomen is soft.     Tenderness: There is abdominal tenderness in the epigastric area.  Skin:    General: Skin is warm and dry.     Coloration: Skin is not jaundiced.  Neurological:     Mental Status: He is alert. Mental status is at baseline.     Coordination: Coordination normal.     ED Results / Procedures / Treatments   Labs (all labs ordered are listed, but only abnormal results are displayed) Labs Reviewed  CBC WITH DIFFERENTIAL/PLATELET - Abnormal; Notable for the following components:      Result Value   HCT 37.8 (*)    All other components within normal limits  COMPREHENSIVE METABOLIC PANEL - Abnormal; Notable for the  following components:   Potassium 3.0 (*)    Glucose, Bld 122 (*)    Calcium 8.4 (*)    Albumin 3.3 (*)    ALT 46 (*)    All other components within normal limits  TROPONIN I (HIGH SENSITIVITY) - Abnormal; Notable for the following components:   Troponin I (High Sensitivity) 102 (*)    All other components within normal limits  RESP PANEL BY RT-PCR (FLU A&B, COVID) ARPGX2  LIPASE, BLOOD  URINALYSIS, ROUTINE W REFLEX MICROSCOPIC  TROPONIN I (HIGH SENSITIVITY)    EKG EKG Interpretation  Date/Time:  Wednesday May 18 2022 16:26:42 EST Ventricular Rate:  97 PR Interval:  168 QRS Duration: 82 QT Interval:  400 QTC Calculation: 508 R Axis:   95 Text Interpretation: Normal sinus rhythm Rightward axis Nonspecific ST and T wave abnormality Prolonged QT Abnormal ECG Confirmed by Carmin Muskrat [phone]) on 05/18/2022 4:31:46 PM  Radiology CT Head Wo Contrast  Result Date: 05/18/2022 CLINICAL DATA:  Sudden severe headaches EXAM: CT HEAD WITHOUT CONTRAST TECHNIQUE: Contiguous axial images were obtained from the base of the skull through the vertex without intravenous contrast. RADIATION DOSE REDUCTION: This exam was performed according to the departmental dose-optimization program which includes automated exposure control, adjustment of the mA and/or kV according to patient size and/or use of iterative reconstruction technique. COMPARISON:  10/01/2013 FINDINGS: Brain: No acute intracranial findings are seen. There are no signs of bleeding within the cranium. Ventricles are not dilated. There are multiple calcifications in both cerebral hemispheres with no significant interval change. There is no adjacent edema or mass effect. This finding may suggest presence of multiple healed granulomas or residual changes from some other inflammatory process such as cysticercosis. Vascular: Unremarkable. Skull: There is decreased in number of air cells in mastoids. No fracture is seen. Sinuses/Orbits: No  acute findings are seen. Other: No significant interval changes are noted. IMPRESSION: No acute intracranial findings are seen in noncontrast CT brain. There are few scattered calcifications of varying sizes in both cerebral hemispheres with no interval change since 10/01/2013 suggesting residual change from previous inflammation. Electronically Signed   By: Elmer Picker M.D.   On: 05/18/2022 12:23    Procedures Procedures    Medications Ordered in ED Medications  hydrALAZINE (APRESOLINE) injection 10 mg (has no administration in time range)  alum & mag hydroxide-simeth (MAALOX/MYLANTA) 200-200-20 MG/5ML suspension 30 mL (30 mLs Oral Given 05/18/22 1643)    And  lidocaine (XYLOCAINE) 2 % viscous mouth solution 15 mL (15 mLs Oral Given 05/18/22 1643)  amLODipine (NORVASC) tablet 10 mg (10 mg Oral Given 05/18/22 1643)  hydrALAZINE (APRESOLINE) tablet 10 mg (10 mg Oral Given 05/18/22 1644)  labetalol (NORMODYNE) injection 10 mg (10 mg Intravenous Given 05/18/22  [email or web address])    ED Course/ Medical Decision Making/ A&P Clinical Course as of 05/18/22 1851  Wed May 18, 2022  1617 Comprehensive metabolic panel(!) Mildly hypokalemic with a potassium of 3.0.  No gross electrolyte derangement or renal impairment.  No transaminitis. [HS]  [id number] CBC with Differential(!) No leukocytosis or anemia [HS]  1617 Lipase, blood Not consistent with pancreatitis [HS]  1617 CT Head Wo Contrast Negative CT head, no intracranial hemorrhage. [HS]  [id number] Patient has been persistently hypertensive, will reorder home medicine 10 amlodipine and 10 hydralazine and continue to monitor.  Given he was having intermittent chest pain in the context hypertensive urgency will check an EKG as well as a troponin. [HS]  [id number] I reevaluated the patient.  He states his headache is feeling improved but he is still having some pain in his epigastric area which did improve somewhat with the GI cocktail.  He is still hypertensive, 195/119  after home dose medications.  Repeat abdominal exam shows tenderness in the epigastric area without peritoneal signs.  [HS]  1840 Troponin I (High Sensitivity)(!!) Initial troponin is elevated at 102.  Will need second troponin. [HS]    Clinical Course User Index [HS] Sherrill Raring, PA-C                           Medical Decision Making Amount and/or Complexity of Data Reviewed Labs: ordered. Decision-making details documented in ED Course. Radiology: ordered. Decision-making details documented in ED Course.  Risk OTC drugs. Prescription drug management. Decision regarding hospitalization.   This patient presents to the ED for concern of hypertension, headache, abdominal pain and intermittent chest pain, this involves an extensive number of treatment options, and is a complaint that carries with it a high risk of complications and morbidity.  The differential diagnosis includes evidence of urgency, hypertensive emergency, renal impairment, no acute intra-abdominal process, it AAA, dissection, intracranial hemorrhage.   Additional history obtained:   Independent historian: wife  I reviewed external medical records including PCP note from yesterday.  Patient has been off his blood pressure medicine for some time.  I also reviewed patient's home doses of medication and reordered his home blood pressure medications.    Lab Tests:  I ordered, viewed, and personally interpreted labs.  The pertinent results include: Please see ED course.      Imaging Studies ordered:  I reviewed and interpreted imaging studies as documented in the ED course. I agree with the radiologist interpretation    ECG/Cardiac monitoring:   Per my interpretation, EKG shows nonspecific ST changes on EKG which are not grossly changed compared to previous.   Medicines ordered and prescription drug management:  I ordered medication including: GI cocktail, hydralazine 10, amlodipine 10, 10 labetalol IV, 10  hydralazine IV  I have reviewed the patients home medicines and have made adjustments as needed  Reevaluation:  After the interventions noted above, I reevaluated the patient and found symptoms improved but persistently hypertensive.    Social Determinants of Health: Has PCP   Problems addressed / ED Course: On exam patient has no appreciable focal deficits.  He has reproducible epigastric abdominal tenderness without rigidity or guarding.  Symptoms seem most viral in etiology given the diarrhea and the nausea.  He is also having a headache which could be consistent with that.  However, in the setting of hypertensive urgency with medical noncompliance and concerned about SAH.  CT head was negative.  His headache also resolved  without any intervention.    AP did improve with a GI cocktail but repeat abdominal exam he is still tender and mildly tachycardic at 104 with significant hypertension despite home medication.  Given patient has an elevated troponin at 102 with epigastric pain in the setting of hypertensive urgency I do think we need to proceed with a dissection study.  This is time of shift change.  Work-up is pending at time of shift change. Patient will need admission for ACS work-up, hypertensive urgency if dissection study is negative. Case discussed with Dr. Vanita Panda is assuming care.         Final Clinical Impression(s) / ED Diagnoses Final diagnoses:  Hypertensive urgency  Hypokalemia  Elevated troponin    Rx / DC Orders ED Discharge Orders     None         Sherrill Raring, PA-C 05/18/22 1851    Carmin Muskrat, MD 05/18/22 2312

## 2022-05-18 NOTE — H&P (Incomplete)
PCP:   Julieanne Manson, MD   Chief Complaint:  Chest pains  HPI: This is a 51 year old male with past medical history of hypertension.  Patient Spanish-speaking only.  On Monday the patient did not feel well.  He had a headache, developed blurred vision, some nausea but no vomiting.  Denies any localized weakness.  On Monday developed mild chest pain.  Pain centrally located without radiation.  At its worst it was once 6 to 2 out of 10, currently 0 out of 10.  He also felt as though he could not breathe.  He came to the ER.  In the ER patient found to have uncontrolled hypertension, with SBP's of 240-250.  Patient received IV labetalol and hydralazine, his chest pain resolved.  Blood pressure improved with systolic blood pressure 150.  Patient troponin elevated 101, 102.  BNP 1192.  EKG with mildly flipped T waves V5, V6.  The hospitalist service has been asked to admit.  History provided by patient and son present at bedside.  Review of Systems:       The patient denies anorexia, fever, weight loss,, vision loss, decreased hearing, hoarseness, chest pain, syncope, dyspnea on exertion, peripheral edema, balance deficits, hemoptysis, abdominal pain, melena, hematochezia, severe indigestion/heartburn, hematuria, incontinence, genital sores, muscle weakness, suspicious skin lesions, transient blindness, difficulty walking, depression, unusual weight change, abnormal bleeding, enlarged lymph nodes, angioedema, and breast masses.  Past Medical History: Past Medical History:  Diagnosis Date   Allergy    Has a nurse friend who gives him a shot every year for his allergies--sounds like corticosteroids   Hyperlipidemia    With good HDL   Hypertension    Obesity (BMI 30-39.9) 2016   Prediabetes    History reviewed. No pertinent surgical history.  Medications: Prior to Admission medications   Medication Sig Start Date End Date Taking? Authorizing Provider  acetaminophen (TYLENOL) 500 MG  tablet Take 1,000 mg by mouth daily as needed for moderate pain.    [provider]  amLODipine (NORVASC) 10 MG tablet TAKE 1 TABLET BY MOUTH EVERY DAY Patient not taking: Reported on 05/18/2022 09/06/17   Julieanne Manson, MD  lisinopril (PRINIVIL,ZESTRIL) 10 MG tablet Take 1 tablet (10 mg total) by mouth daily. Patient not taking: Reported on 05/18/2022 11/10/16   Julieanne Manson, MD  mometasone (NASONEX) 50 MCG/ACT nasal spray 2 sprays each nostril once daily Patient not taking: Reported on 08/29/2018 11/10/16   Julieanne Manson, MD  oseltamivir (TAMIFLU) 75 MG capsule Take 1 capsule (75 mg total) by mouth every 12 (twelve) hours. Patient not taking: Reported on 05/18/2022 08/29/18   Elisha Ponder, PA-C    Allergies:  No Known Allergies  Social History:  reports that he has been smoking cigarettes. He has never used smokeless tobacco. He reports current alcohol use. He reports that he does not use drugs.  Family History: Family History  Problem Relation Age of Onset   Heart disease Mother        CAD   Heart disease Brother 71       one brother with heart valve problems    Physical Exam: Vitals:   05/18/22 2037 05/18/22 2100 05/18/22 2208 05/18/22 2209  BP:  (!) 157/96 (!) 155/100   Pulse:  89 87 88  Resp:  20 20 19   Temp: 98.4 F (36.9 C)     TempSrc: Oral     SpO2:  98% 99% 99%    General:  Alert and oriented times three, well  developed and nourished, no acute distress Eyes: PERRLA, pink conjunctiva, no scleral icterus ENT: Moist oral mucosa, neck supple, no thyromegaly Lungs: clear to ascultation, no wheeze, no crackles, no use of accessory muscles Cardiovascular: regular rate and rhythm, no regurgitation, no gallops, no murmurs. No carotid bruits, no JVD Abdomen: soft, positive BS, non-tender, non-distended, no organomegaly, not an acute abdomen GU: not examined Neuro: CN II - XII grossly intact, sensation intact Musculoskeletal: strength 5/5 all  extremities, no clubbing, cyanosis or edema Skin: no rash, no subcutaneous crepitation, no decubitus Psych: appropriate patient   Labs on Admission:  Recent Labs    05/18/22 1144  NA 136  K 3.0*  CL 99  CO2 26  GLUCOSE 122*  BUN 8  CREATININE 1.22  CALCIUM 8.4*   Recent Labs    05/18/22 1144  AST 39  ALT 46*  ALKPHOS 67  BILITOT 0.9  PROT 7.4  ALBUMIN 3.3*   Recent Labs    05/18/22 1144  LIPASE 26   Recent Labs    05/18/22 1144  WBC 9.8  NEUTROABS 7.1  HGB 13.1  HCT 37.8*  MCV 86.7  PLT 264    Micro Results: Recent Results (from the past 240 hour(s))  Resp Panel by RT-PCR (Flu A&B, Covid)     Status: None   Collection Time: 05/18/22  4:00 PM   Specimen: Nasal Swab  Result Value Ref Range Status   SARS Coronavirus 2 by RT PCR NEGATIVE NEGATIVE Final    Comment: (NOTE) SARS-CoV-2 target nucleic acids are NOT DETECTED.  The SARS-CoV-2 RNA is generally detectable in upper respiratory specimens during the acute phase of infection. The lowest concentration of SARS-CoV-2 viral copies this assay can detect is 138 copies/mL. A negative result does not preclude SARS-Cov-2 infection and should not be used as the sole basis for treatment or other patient management decisions. A negative result may occur with  improper specimen collection/handling, submission of specimen other than nasopharyngeal swab, presence of viral mutation(s) within the areas targeted by this assay, and inadequate number of viral copies(<138 copies/mL). A negative result must be combined with clinical observations, patient history, and epidemiological information. The expected result is Negative.  Fact Sheet for Patients:  EntrepreneurPulse.com.au  Fact Sheet for Healthcare Providers:  IncredibleEmployment.be  This test is no t yet approved or cleared by the [place] FDA and  has been authorized for detection and/or diagnosis of SARS-CoV-2  by FDA under an Emergency Use Authorization (EUA). This EUA will remain  in effect (meaning this test can be used) for the duration of the COVID-19 declaration under Section 564(b)(1) of the Act, 21 U.S.C.section 360bbb-3(b)(1), unless the authorization is terminated  or revoked sooner.       Influenza A by PCR NEGATIVE NEGATIVE Final   Influenza B by PCR NEGATIVE NEGATIVE Final    Comment: (NOTE) The Xpert Xpress SARS-CoV-2/FLU/RSV plus assay is intended as an aid in the diagnosis of influenza from Nasopharyngeal swab specimens and should not be used as a sole basis for treatment. Nasal washings and aspirates are unacceptable for Xpert Xpress SARS-CoV-2/FLU/RSV testing.  Fact Sheet for Patients: EntrepreneurPulse.com.au  Fact Sheet for Healthcare Providers: IncredibleEmployment.be  This test is not yet approved or cleared by the [place] FDA and has been authorized for detection and/or diagnosis of SARS-CoV-2 by FDA under an Emergency Use Authorization (EUA). This EUA will remain in effect (meaning this test can be used) for the duration of the COVID-19 declaration under Section 564(b)(1)  of the Act, 21 U.S.C. section 360bbb-3(b)(1), unless the authorization is terminated or revoked.  Performed at [facility], [place] [address] [place], [place] [address]      Radiological Exams on Admission: CT Angio Chest/Abd/Pel for Dissection W and/or W/WO  Result Date: 05/18/2022 CLINICAL DATA:  Head and stomach pain since yesterday, hypertension EXAM: CT ANGIOGRAPHY CHEST, ABDOMEN AND PELVIS TECHNIQUE: Non-contrast CT of the chest was initially obtained. Multidetector CT imaging through the chest, abdomen and pelvis was performed using the standard protocol during bolus administration of intravenous contrast. Multiplanar reconstructed images and MIPs were obtained and reviewed to evaluate the vascular anatomy. RADIATION DOSE REDUCTION: This  exam was performed according to the departmental dose-optimization program which includes automated exposure control, adjustment of the mA and/or kV according to patient size and/or use of iterative reconstruction technique. CONTRAST:  8mL OMNIPAQUE IOHEXOL 350 MG/ML SOLN COMPARISON:  08/29/2018 FINDINGS: CTA CHEST FINDINGS Cardiovascular: No evidence of thoracic aortic aneurysm or dissection. Mild cardiomegaly without pericardial effusion. Atherosclerosis of the coronary vasculature. Mediastinum/Nodes: No enlarged mediastinal, hilar, or axillary lymph nodes. Thyroid gland, trachea, and esophagus demonstrate no significant findings. Lungs/Pleura: Trace bilateral pleural effusions, right greater than left. No airspace disease or pneumothorax. Central airways are patent. Musculoskeletal: There are no acute or destructive bony lesions. Reconstructed images demonstrate no additional findings. Review of the MIP images confirms the above findings. CTA ABDOMEN AND PELVIS FINDINGS VASCULAR Aorta: Normal caliber aorta without aneurysm, dissection, vasculitis or significant stenosis. Atherosclerosis of the distal aorta. Celiac: Patent without evidence of aneurysm, dissection, vasculitis or significant stenosis. SMA: Patent without evidence of aneurysm, dissection, vasculitis or significant stenosis. Renals: Both renal arteries are patent without evidence of aneurysm, dissection, vasculitis, fibromuscular dysplasia or significant stenosis. IMA: Patent without evidence of aneurysm, dissection, vasculitis or significant stenosis. Inflow: Patent without evidence of aneurysm, dissection, vasculitis or significant stenosis. Veins: No obvious venous abnormality within the limitations of this arterial phase study. Review of the MIP images confirms the above findings. NON-VASCULAR Hepatobiliary: Nodularity of the liver capsule and hypertrophy of the left lobe liver consistent with cirrhosis. No focal parenchymal liver abnormality.  Gallbladder is unremarkable. Pancreas: Unremarkable. No pancreatic ductal dilatation or surrounding inflammatory changes. Spleen: Normal in size without focal abnormality. Adrenals/Urinary Tract: Adrenal glands are unremarkable. Kidneys are normal, without renal calculi, focal lesion, or hydronephrosis. Bladder is unremarkable. Stomach/Bowel: No bowel obstruction or ileus. Normal appendix right lower quadrant. No bowel wall thickening or inflammatory change. Lymphatic: No pathologic adenopathy. Reproductive: Prostate is unremarkable. Other: No free fluid or free intraperitoneal gas. No abdominal wall hernia. Musculoskeletal: No acute or destructive bony lesions. Reconstructed images demonstrate no additional findings. Review of the MIP images confirms the above findings. IMPRESSION: 1. No evidence of thoracoabdominal aortic aneurysm or dissection. 2. Trace bilateral pleural effusions, right greater than left. 3. Nodular liver capsule and left lobe hypertrophy compatible with cirrhosis. 4.  Aortic Atherosclerosis (ICD10-I70.0). Electronically Signed   By: Randa Ngo M.D.   On: 05/18/2022 20:39   CT Head Wo Contrast  Result Date: 05/18/2022 CLINICAL DATA:  Sudden severe headaches EXAM: CT HEAD WITHOUT CONTRAST TECHNIQUE: Contiguous axial images were obtained from the base of the skull through the vertex without intravenous contrast. RADIATION DOSE REDUCTION: This exam was performed according to the departmental dose-optimization program which includes automated exposure control, adjustment of the mA and/or kV according to patient size and/or use of iterative reconstruction technique. COMPARISON:  10/01/2013 FINDINGS: Brain: No acute intracranial findings are seen. There  are no signs of bleeding within the cranium. Ventricles are not dilated. There are multiple calcifications in both cerebral hemispheres with no significant interval change. There is no adjacent edema or mass effect. This finding may suggest  presence of multiple healed granulomas or residual changes from some other inflammatory process such as cysticercosis. Vascular: Unremarkable. Skull: There is decreased in number of air cells in mastoids. No fracture is seen. Sinuses/Orbits: No acute findings are seen. Other: No significant interval changes are noted. IMPRESSION: No acute intracranial findings are seen in noncontrast CT brain. There are few scattered calcifications of varying sizes in both cerebral hemispheres with no interval change since 10/01/2013 suggesting residual change from previous inflammation. Electronically Signed   By: Ernie Avena M.D.   On: 05/18/2022 12:23    Assessment/Plan Present on Admission:  Hypertensive urgency -Brief overnight observation on med telemetry -Patient currently not taking any medication.  He was on lisinopril and Norvasc  previously.  Will resume lisinopril 10 mg daily, HCTZ 25 mg p.o. daily, first doses now -IV hydralazine as needed systolic blood pressure greater than 160 -Titrate blood pressure meds as needed   Chest pain/elevated troponin -Likely secondary to uncontrolled hypertension -Cardiology consulted by ER physician, will see patient -2D echo in a.m. -BNP elevated greater than thousand.  CT chest with evidence of fluid overload.  Patient appears euvolemic on examination.  Hypokalemia -Replacing potassium -Magnesium, phosphorus levels added to. -Single dose IV magnesium 1 mg ordered -BMP, magnesium levels in a.m.  Alcohol use -2-3 beers daily -CIWA protocol initiated.  Liver cirrhosis -h/o heavy alcohol use -Patient updated  Alexandr Yaworski 05/18/2022, 10:24 PM

## 2022-05-18 NOTE — ED Notes (Signed)
Pt eating a sandwich in the hallway. Pt in NAD at this time.

## 2022-05-19 ENCOUNTER — Encounter (HOSPITAL_COMMUNITY): Payer: Self-pay | Admitting: Family Medicine

## 2022-05-19 ENCOUNTER — Observation Stay (HOSPITAL_COMMUNITY): Payer: 59

## 2022-05-19 DIAGNOSIS — T502X5A Adverse effect of carbonic-anhydrase inhibitors, benzothiadiazides and other diuretics, initial encounter: Secondary | ICD-10-CM | POA: Diagnosis not present

## 2022-05-19 DIAGNOSIS — F1090 Alcohol use, unspecified, uncomplicated: Secondary | ICD-10-CM | POA: Diagnosis not present

## 2022-05-19 DIAGNOSIS — I2489 Other forms of acute ischemic heart disease: Secondary | ICD-10-CM | POA: Diagnosis not present

## 2022-05-19 DIAGNOSIS — I700 Atherosclerosis of aorta: Secondary | ICD-10-CM | POA: Diagnosis not present

## 2022-05-19 DIAGNOSIS — Z91148 Patient's other noncompliance with medication regimen for other reason: Secondary | ICD-10-CM | POA: Diagnosis not present

## 2022-05-19 DIAGNOSIS — E669 Obesity, unspecified: Secondary | ICD-10-CM | POA: Diagnosis not present

## 2022-05-19 DIAGNOSIS — Z23 Encounter for immunization: Secondary | ICD-10-CM | POA: Diagnosis not present

## 2022-05-19 DIAGNOSIS — I472 Ventricular tachycardia, unspecified: Secondary | ICD-10-CM | POA: Diagnosis not present

## 2022-05-19 DIAGNOSIS — E785 Hyperlipidemia, unspecified: Secondary | ICD-10-CM | POA: Diagnosis not present

## 2022-05-19 DIAGNOSIS — I110 Hypertensive heart disease with heart failure: Secondary | ICD-10-CM | POA: Diagnosis not present

## 2022-05-19 DIAGNOSIS — I5031 Acute diastolic (congestive) heart failure: Secondary | ICD-10-CM | POA: Diagnosis not present

## 2022-05-19 DIAGNOSIS — N179 Acute kidney failure, unspecified: Secondary | ICD-10-CM | POA: Diagnosis not present

## 2022-05-19 DIAGNOSIS — E876 Hypokalemia: Secondary | ICD-10-CM | POA: Diagnosis not present

## 2022-05-19 DIAGNOSIS — Z79899 Other long term (current) drug therapy: Secondary | ICD-10-CM | POA: Diagnosis not present

## 2022-05-19 DIAGNOSIS — K7460 Unspecified cirrhosis of liver: Secondary | ICD-10-CM | POA: Diagnosis not present

## 2022-05-19 DIAGNOSIS — R7303 Prediabetes: Secondary | ICD-10-CM | POA: Diagnosis not present

## 2022-05-19 DIAGNOSIS — I161 Hypertensive emergency: Secondary | ICD-10-CM | POA: Diagnosis not present

## 2022-05-19 DIAGNOSIS — F17210 Nicotine dependence, cigarettes, uncomplicated: Secondary | ICD-10-CM | POA: Diagnosis not present

## 2022-05-19 DIAGNOSIS — R7989 Other specified abnormal findings of blood chemistry: Secondary | ICD-10-CM

## 2022-05-19 DIAGNOSIS — K7030 Alcoholic cirrhosis of liver without ascites: Secondary | ICD-10-CM

## 2022-05-19 DIAGNOSIS — Z8249 Family history of ischemic heart disease and other diseases of the circulatory system: Secondary | ICD-10-CM | POA: Diagnosis not present

## 2022-05-19 DIAGNOSIS — Z6830 Body mass index (BMI) 30.0-30.9, adult: Secondary | ICD-10-CM | POA: Diagnosis not present

## 2022-05-19 DIAGNOSIS — R079 Chest pain, unspecified: Secondary | ICD-10-CM

## 2022-05-19 DIAGNOSIS — R809 Proteinuria, unspecified: Secondary | ICD-10-CM | POA: Diagnosis not present

## 2022-05-19 DIAGNOSIS — Z1152 Encounter for screening for COVID-19: Secondary | ICD-10-CM | POA: Diagnosis not present

## 2022-05-19 LAB — CBC
HCT: 36.8 % — ABNORMAL LOW (ref 39.0–52.0)
Hemoglobin: 12.6 g/dL — ABNORMAL LOW (ref 13.0–17.0)
MCH: 29.8 pg (ref 26.0–34.0)
MCHC: 34.2 g/dL (ref 30.0–36.0)
MCV: 87 fL (ref 80.0–100.0)
Platelets: 249 10*3/uL (ref 150–400)
RBC: 4.23 MIL/uL (ref 4.22–5.81)
RDW: 13.6 % (ref 11.5–15.5)
WBC: 9.7 10*3/uL (ref 4.0–10.5)
nRBC: 0 % (ref 0.0–0.2)

## 2022-05-19 LAB — CREATININE, SERUM
Creatinine, Ser: 1.24 mg/dL (ref 0.61–1.24)
GFR, Estimated: >60 mL/min (ref >60)

## 2022-05-19 LAB — LIPID PANEL
Cholesterol: 204 mg/dL — ABNORMAL HIGH (ref 0–200)
HDL: 44 mg/dL (ref >40)
LDL Cholesterol: 146 mg/dL — ABNORMAL HIGH (ref 0–99)
Total CHOL/HDL Ratio: 4.6 RATIO
Triglycerides: 69 mg/dL (ref <150)
VLDL: 14 mg/dL (ref 0–40)

## 2022-05-19 LAB — ECHOCARDIOGRAM COMPLETE
AR max vel: 2.58 cm2
AV Area VTI: 2.62 cm2
AV Area mean vel: 2.6 cm2
AV Mean grad: 3 mmHg
AV Peak grad: 6.6 mmHg
Ao pk vel: 1.28 m/s
Area-P 1/2: 3.79 cm2
MV VTI: 1.61 cm2
S' Lateral: 3.1 cm

## 2022-05-19 LAB — BASIC METABOLIC PANEL
Anion gap: 10 (ref 5–15)
BUN: 11 mg/dL (ref 6–20)
CO2: 27 mmol/L (ref 22–32)
Calcium: 8.8 mg/dL — ABNORMAL LOW (ref 8.9–10.3)
Chloride: 98 mmol/L (ref 98–111)
Creatinine, Ser: 1.33 mg/dL — ABNORMAL HIGH (ref 0.61–1.24)
GFR, Estimated: >60 mL/min (ref >60)
Glucose, Bld: 132 mg/dL — ABNORMAL HIGH (ref 70–99)
Potassium: 3.2 mmol/L — ABNORMAL LOW (ref 3.5–5.1)
Sodium: 135 mmol/L (ref 135–145)

## 2022-05-19 LAB — CBG MONITORING, ED: Glucose-Capillary: 92 mg/dL (ref 70–99)

## 2022-05-19 LAB — GLUCOSE, CAPILLARY
Glucose-Capillary: 156 mg/dL — ABNORMAL HIGH (ref 70–99)
Glucose-Capillary: 155 mg/dL — ABNORMAL HIGH (ref 70–99)

## 2022-05-19 LAB — PHOSPHORUS: Phosphorus: 3 mg/dL (ref 2.5–4.6)

## 2022-05-19 LAB — HIV ANTIBODY (ROUTINE TESTING W REFLEX): HIV Screen 4th Generation wRfx: NONREACTIVE

## 2022-05-19 LAB — HEMOGLOBIN A1C
Hgb A1c MFr Bld: 6.4 % — ABNORMAL HIGH (ref 4.8–5.6)
Mean Plasma Glucose: 136.98 mg/dL

## 2022-05-19 LAB — MAGNESIUM: Magnesium: 2.2 mg/dL (ref 1.7–2.4)

## 2022-05-19 MED ORDER — INSULIN ASPART 100 UNIT/ML IJ SOLN
0.0000 [IU] | Freq: Three times a day (TID) | INTRAMUSCULAR | Status: DC
  Administered 2022-05-19: 2 [IU] via SUBCUTANEOUS
  Administered 2022-05-20 (×3): 1 [IU] via SUBCUTANEOUS

## 2022-05-19 MED ORDER — PERFLUTREN LIPID MICROSPHERE
1.0000 mL | INTRAVENOUS | Status: AC | PRN
  Administered 2022-05-19: 2 mL via INTRAVENOUS

## 2022-05-19 MED ORDER — POTASSIUM CHLORIDE CRYS ER 20 MEQ PO TBCR
40.0000 meq | EXTENDED_RELEASE_TABLET | Freq: Once | ORAL | Status: AC
  Administered 2022-05-19: 40 meq via ORAL
  Filled 2022-05-19: qty 2

## 2022-05-19 MED ORDER — INFLUENZA VAC SPLIT QUAD 0.5 ML IM SUSY
0.5000 mL | PREFILLED_SYRINGE | INTRAMUSCULAR | Status: AC
  Administered 2022-05-20: 0.5 mL via INTRAMUSCULAR
  Filled 2022-05-19: qty 0.5

## 2022-05-19 MED ORDER — PNEUMOCOCCAL 20-VAL CONJ VACC 0.5 ML IM SUSY
0.5000 mL | PREFILLED_SYRINGE | INTRAMUSCULAR | Status: AC
  Administered 2022-05-20: 0.5 mL via INTRAMUSCULAR
  Filled 2022-05-19: qty 0.5

## 2022-05-19 MED ORDER — MAGNESIUM SULFATE IN D5W 1-5 GM/100ML-% IV SOLN
1.0000 g | Freq: Once | INTRAVENOUS | Status: AC
  Administered 2022-05-19: 1 g via INTRAVENOUS
  Filled 2022-05-19: qty 100

## 2022-05-19 MED ORDER — POTASSIUM CHLORIDE CRYS ER 20 MEQ PO TBCR
40.0000 meq | EXTENDED_RELEASE_TABLET | ORAL | Status: AC
  Administered 2022-05-19 (×2): 40 meq via ORAL
  Filled 2022-05-19 (×2): qty 2

## 2022-05-19 MED ORDER — METOPROLOL TARTRATE 25 MG PO TABS
25.0000 mg | ORAL_TABLET | Freq: Two times a day (BID) | ORAL | Status: DC
  Administered 2022-05-19: 25 mg via ORAL
  Filled 2022-05-19: qty 1

## 2022-05-19 MED ORDER — ROSUVASTATIN CALCIUM 20 MG PO TABS
20.0000 mg | ORAL_TABLET | Freq: Every day | ORAL | Status: DC
  Administered 2022-05-19 – 2022-05-21 (×3): 20 mg via ORAL
  Filled 2022-05-19 (×3): qty 1

## 2022-05-19 NOTE — Progress Notes (Signed)
  Echocardiogram 2D Echocardiogram has been performed.  Gerda Diss 05/19/2022, 11:47 AM

## 2022-05-19 NOTE — Progress Notes (Addendum)
Rounding Note    Patient Name: Jeshawn Razo-Amezquita Date of Encounter: 05/19/2022  [facility] Cardiologist: None   Subjective   Denies any further chest pain, breathing ok.   Inpatient Medications    Scheduled Meds:  aspirin EC  81 mg Oral Daily   enoxaparin (LOVENOX) injection  40 mg Subcutaneous Q24H   hydrochlorothiazide  25 mg Oral Daily   lisinopril  10 mg Oral Daily   Continuous Infusions:  PRN Meds: acetaminophen **OR** acetaminophen, metoprolol tartrate, ondansetron **OR** ondansetron (ZOFRAN) IV, senna-docusate   Vital Signs    Vitals:   05/19/22 0051 05/19/22 0100 05/19/22 0444 05/19/22 0645  BP:  (!) 140/79  (!) 153/94  Pulse:  98  79  Resp:  (!) 31  (!) 22  Temp: 98.5 F (36.9 C)  97.8 F (36.6 C)   TempSrc: Oral     SpO2:  94%  100%    Intake/Output Summary (Last 24 hours) at 05/19/2022 0713 Last data filed at 05/19/2022 0211 Gross per 24 hour  Intake 92.59 ml  Output --  Net 92.59 ml      02/09/2017    8:31 AM 11/10/2016    8:41 AM 08/16/2016    8:27 AM  Last 3 Weights  Weight (lbs) 206 lb 204 lb 217 lb  Weight (kg) 93.441 kg 92.534 kg 98.431 kg      Telemetry    NSR without significant ventricular ectopy - Personally Reviewed  ECG    NSR with LVH  - Personally Reviewed  Physical Exam   GEN: No acute distress.   Neck: No JVD Cardiac: RRR, no murmurs, rubs, or gallops.  Respiratory: Clear to auscultation bilaterally with bibasilar crackles GI: Soft, nontender, non-distended  MS: No edema; No deformity. Neuro:  Nonfocal  Psych: Normal affect   Labs    High Sensitivity Troponin:   Recent Labs  Lab 05/18/22 1700 05/18/22 1910  TROPONINIHS 102* 101*     Chemistry Recent Labs  Lab 05/18/22 1144 05/18/22 2315 05/19/22 0356  NA 136  --  135  K 3.0*  --  3.2*  CL 99  --  98  CO2 26  --  27  GLUCOSE 122*  --  132*  BUN 8  --  11  CREATININE 1.22 1.24 1.33*  CALCIUM 8.4*  --  8.8*  MG  --   --  2.2  PROT  7.4  --   --   ALBUMIN 3.3*  --   --   AST 39  --   --   ALT 46*  --   --   ALKPHOS 67  --   --   BILITOT 0.9  --   --   GFRNONAA >60 >60 >60  ANIONGAP 11  --  10    Lipids  Recent Labs  Lab 05/18/22 2315  CHOL 204*  TRIG 69  HDL 44  LDLCALC 146*  CHOLHDL 4.6    Hematology Recent Labs  Lab 05/18/22 1144 05/18/22 2315 05/19/22 0356  WBC 9.8 10.7* 9.7  RBC 4.36 4.27 4.23  HGB 13.1 12.7* 12.6*  HCT 37.8* 36.9* 36.8*  MCV 86.7 86.4 87.0  MCH 30.0 29.7 29.8  MCHC 34.7 34.4 34.2  RDW 13.5 13.6 13.6  PLT 264 293 249   Thyroid No results for input(s): "TSH", "FREET4" in the last 168 hours.  BNP Recent Labs  Lab 05/18/22 2206  BNP 1,192.0*    DDimer No results for input(s): "DDIMER" in the last  168 hours.   Radiology    CT Angio Chest/Abd/Pel for Dissection W and/or W/WO  Result Date: 05/18/2022 CLINICAL DATA:  Head and stomach pain since yesterday, hypertension EXAM: CT ANGIOGRAPHY CHEST, ABDOMEN AND PELVIS TECHNIQUE: Non-contrast CT of the chest was initially obtained. Multidetector CT imaging through the chest, abdomen and pelvis was performed using the standard protocol during bolus administration of intravenous contrast. Multiplanar reconstructed images and MIPs were obtained and reviewed to evaluate the vascular anatomy. RADIATION DOSE REDUCTION: This exam was performed according to the departmental dose-optimization program which includes automated exposure control, adjustment of the mA and/or kV according to patient size and/or use of iterative reconstruction technique. CONTRAST:  60mL OMNIPAQUE IOHEXOL 350 MG/ML SOLN COMPARISON:  08/29/2018 FINDINGS: CTA CHEST FINDINGS Cardiovascular: No evidence of thoracic aortic aneurysm or dissection. Mild cardiomegaly without pericardial effusion. Atherosclerosis of the coronary vasculature. Mediastinum/Nodes: No enlarged mediastinal, hilar, or axillary lymph nodes. Thyroid gland, trachea, and esophagus demonstrate no significant  findings. Lungs/Pleura: Trace bilateral pleural effusions, right greater than left. No airspace disease or pneumothorax. Central airways are patent. Musculoskeletal: There are no acute or destructive bony lesions. Reconstructed images demonstrate no additional findings. Review of the MIP images confirms the above findings. CTA ABDOMEN AND PELVIS FINDINGS VASCULAR Aorta: Normal caliber aorta without aneurysm, dissection, vasculitis or significant stenosis. Atherosclerosis of the distal aorta. Celiac: Patent without evidence of aneurysm, dissection, vasculitis or significant stenosis. SMA: Patent without evidence of aneurysm, dissection, vasculitis or significant stenosis. Renals: Both renal arteries are patent without evidence of aneurysm, dissection, vasculitis, fibromuscular dysplasia or significant stenosis. IMA: Patent without evidence of aneurysm, dissection, vasculitis or significant stenosis. Inflow: Patent without evidence of aneurysm, dissection, vasculitis or significant stenosis. Veins: No obvious venous abnormality within the limitations of this arterial phase study. Review of the MIP images confirms the above findings. NON-VASCULAR Hepatobiliary: Nodularity of the liver capsule and hypertrophy of the left lobe liver consistent with cirrhosis. No focal parenchymal liver abnormality. Gallbladder is unremarkable. Pancreas: Unremarkable. No pancreatic ductal dilatation or surrounding inflammatory changes. Spleen: Normal in size without focal abnormality. Adrenals/Urinary Tract: Adrenal glands are unremarkable. Kidneys are normal, without renal calculi, focal lesion, or hydronephrosis. Bladder is unremarkable. Stomach/Bowel: No bowel obstruction or ileus. Normal appendix right lower quadrant. No bowel wall thickening or inflammatory change. Lymphatic: No pathologic adenopathy. Reproductive: Prostate is unremarkable. Other: No free fluid or free intraperitoneal gas. No abdominal wall hernia. Musculoskeletal:  No acute or destructive bony lesions. Reconstructed images demonstrate no additional findings. Review of the MIP images confirms the above findings. IMPRESSION: 1. No evidence of thoracoabdominal aortic aneurysm or dissection. 2. Trace bilateral pleural effusions, right greater than left. 3. Nodular liver capsule and left lobe hypertrophy compatible with cirrhosis. 4.  Aortic Atherosclerosis (ICD10-I70.0). Electronically Signed   By: Randa Ngo M.D.   On: 05/18/2022 20:39   CT Head Wo Contrast  Result Date: 05/18/2022 CLINICAL DATA:  Sudden severe headaches EXAM: CT HEAD WITHOUT CONTRAST TECHNIQUE: Contiguous axial images were obtained from the base of the skull through the vertex without intravenous contrast. RADIATION DOSE REDUCTION: This exam was performed according to the departmental dose-optimization program which includes automated exposure control, adjustment of the mA and/or kV according to patient size and/or use of iterative reconstruction technique. COMPARISON:  10/01/2013 FINDINGS: Brain: No acute intracranial findings are seen. There are no signs of bleeding within the cranium. Ventricles are not dilated. There are multiple calcifications in both cerebral hemispheres with no significant interval change. There is no adjacent  edema or mass effect. This finding may suggest presence of multiple healed granulomas or residual changes from some other inflammatory process such as cysticercosis. Vascular: Unremarkable. Skull: There is decreased in number of air cells in mastoids. No fracture is seen. Sinuses/Orbits: No acute findings are seen. Other: No significant interval changes are noted. IMPRESSION: No acute intracranial findings are seen in noncontrast CT brain. There are few scattered calcifications of varying sizes in both cerebral hemispheres with no interval change since 10/01/2013 suggesting residual change from previous inflammation. Electronically Signed   By: Elmer Picker M.D.    On: 05/18/2022 12:23    Cardiac Studies   Pending echo  Patient Profile     51 y.o. male with PMH of HTN and HLD who has self-discontinued anti-hypertensive meds 2 years ago due to symptom of fatigue and presyncope came in with dyspnea, epigastric pain and hypertensive emergency with trop leak. BP on arrival 237/139. He received 10 mg of amlodipine, 2 doses of 10 mg of IV hydralazine, 10 mg of IV labetalol, and 10 mg of lisinopril.   Assessment & Plan    Hypertensive emergency  - CT of head showed no acute finding. CTA chest abdomen pelvis showed no dissection, trace bilateral pleural effusion, sign of cirrhosis.    - currently on HCTZ 25mg  and lisinopril 10mg , current SBP 140-150s.   - continue on current therapy, increase lisinopril to 20mg  daily tomorrow AM.   Epigastric pain: Serial trop 102 --> 101.   - atypical symptom, started Monday afternoon during work break. 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 Pain is epigastric in location, lasting several min before going away. Last episode was 4PM yesterday. Likely related to high blood pressure, no sign of dissection on CT. Serial trop flat.   Dyspnea: BNP 1192. Obtain echocardiogram. Given 20 mg IV lasix this morning, will give another 20mg  IV lasix this afternoon. Likely hypertension induced pulmonary edema.   Hyperlipidemia  Prediabetes: hgb A1C 6.4  Hypokalemia: K 3.2.   Proteinuria: Cr 1.2, >300 protein in the urine, started on lisinopril for BP and renal protection  For questions or updates, please contact [place] Please consult [email or web address] for contact info under     Signed, Almyra Deforest, [place]  05/19/2022, 7:13 AM   As above, patient seen and examined.  He denies chest pain or dyspnea this morning.  He was admitted with hypertensive emergency and epigastric pain.  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  Initial blood  pressure 212/126.  Lisinopril and hydrochlorothiazide has been added.  Blood pressure now 140/95.  We will continue present regimen and advance slowly to optimize control.  Troponins are not consistent with acute coronary syndrome.  We will plan echocardiogram to assess LV function.  Follow potassium and renal function.  Note hemoglobin A1c is elevated and he will likely need treatment for diabetes mellitus.  Kirk Ruths, MD

## 2022-05-19 NOTE — Progress Notes (Signed)
[place], is a 51 y.o. male, DOB - October 15, 1970, [id number] Admit date - 05/18/2022    Outpatient Primary MD for the patient is Mack Hook, MD  LOS - 0  days  Chief Complaint  Patient presents with   Abdominal Pain   Headache       Brief summary   Patient is a 51 year old male with HTN, prediabetes, obesity, HLP, presented to ED due to headache and shortness of breath.  Patient reported that on Monday, 2 days prior to admission, he did not feel well, had headache, developed blurred vision, nausea but no vomiting.  No other localized weakness.  Subsequently he developed mild chest pain, central without any radiation.  Per patient, he had self discontinued his antihypertensives 2 years ago due to symptoms of fatigue and dizziness. He presented to ER, was found to have uncontrolled hypertension with SBP 237/139, heart rate of 101.  Patient received IV labetalol and hydralazine with resolution of the chest pain.  Troponins was noted to be elevated with BNP 1192.  Cardiology was consulted.  Patient was admitted for further work-up.  Assessment & Plan    Principal Problem:   Hypertensive emergency, chest pain -Patient presented with symptoms of headache, blurred vision, shortness of breath, with elevated BNP and troponins, SBP in 240s. -Patient received IV labetalol, hydralazine, IV Lasix -Follow 2D echo -BP still elevated this morning 154/102 -Appreciate cardiology recommendation, started on HCTZ 25 mg and lisinopril 10 mg, received IV Lasix.   -Recommended increase lisinopril to 20 mg tomorrow, follow renal function -CTA showed no PE or aortic dissection, trace bilateral pleural effusion   Active Problems:    Acute diastolic CHF (congestive heart failure) (HCC) -BNP 1192, has received IV Lasix.  Likely hypertension induced pulmonary edema -Follow 2D  echo  Elevated troponins -Likely due to demand ischemia from hypertensive emergency -Follow 2D echo, currently chest pain improved    Hyperlipidemia -Cholesterol 204, HDL 44, LDL 146 -Placed on Crestor 20 mg daily     Prediabetes -Hemoglobin A1c 6.4 -Recommended diet and weight control, low-dose metformin 500 mg daily at discharge  -Needs to follow-up closely with his PCP,     Cirrhosis of liver ([place]) -CT chest abdomen pelvis showed nodular liver capsule and left lobe hypertrophy compatible with cirrhosis.  Prior history of alcohol use. -Avoid alcohol, outpatient follow-up with PCP and GI    Hypokalemia -Replaced  Proteinuria, AKI -Creatinine worsened to 1.3 today, likely due to Lasix and diuresis -UA > 300 proteinuria, will benefit from ACE/ARB, follow renal function  Obesity Estimated body mass index is 37.68 kg/m as calculated from the following:   Height as of 02/09/17: 5\' 2"  (1.575 m).   Weight as of 02/09/17: 93.4 kg.  Code Status: full  DVT Prophylaxis:  enoxaparin (LOVENOX) injection 40 mg Start: 05/19/22 1000 SCDs Start: 05/18/22 2243   Level of Care: Level of care: Telemetry Medical Family Communication: Updated patient   Disposition Plan:      Remains  inpatient appropriate: Work-up In progress   Procedures:  None  Consultants:   Cardiology Antimicrobials: none     Medications  aspirin EC  81 mg Oral Daily   enoxaparin (LOVENOX) injection  40 mg Subcutaneous Q24H   hydrochlorothiazide  25 mg Oral Daily   insulin aspart  0-9 Units Subcutaneous TID WC   lisinopril  10 mg Oral Daily      Subjective:   Austin Moreno was seen and examined today.  Headache, improving.  Chest pain has resolved.  No nausea or vomiting, abdominal pain.  BP readings are still elevated   Objective:   Vitals:   05/19/22 1015 05/19/22 1017 05/19/22 1017 05/19/22 1045  BP: (!) 142/89   (!) 142/99  Pulse: 83   80  Resp: 18   (!) 26  Temp:  97.8 F (36.6 C)  97.8 F (36.6 C)   TempSrc:  Oral    SpO2: 100%   100%    Intake/Output Summary (Last 24 hours) at 05/19/2022 1128 Last data filed at 05/19/2022 0211 Gross per 24 hour  Intake 92.59 ml  Output --  Net 92.59 ml     Wt Readings from Last 3 Encounters:  02/09/17 93.4 kg  11/10/16 92.5 kg  08/16/16 98.4 kg     Exam General: Alert and oriented x 3, NAD Cardiovascular: S1 S2 auscultated,  RRR Respiratory: Clear to auscultation bilaterally, no wheezing Gastrointestinal: Soft, nontender, nondistended, + bowel sounds Ext: no pedal edema bilaterally Neuro: Strength 5/5 upper and lower extremities bilaterally Psych: Normal affect and demeanor, alert and oriented x3     Data Reviewed:  I have personally reviewed following labs    CBC Lab Results  Component Value Date   WBC 9.7 05/19/2022   RBC 4.23 05/19/2022   HGB 12.6 (L) 05/19/2022   HCT 36.8 (L) 05/19/2022   MCV 87.0 05/19/2022   MCH 29.8 05/19/2022   PLT 249 05/19/2022   MCHC 34.2 05/19/2022   RDW 13.6 05/19/2022   LYMPHSABS 1.8 05/18/2022   MONOABS 0.7 05/18/2022   EOSABS 0.1 05/18/2022   BASOSABS 0.1 05/18/2022     Last metabolic panel Lab Results  Component Value Date   NA 135 05/19/2022   K 3.2 (L) 05/19/2022   CL 98 05/19/2022   CO2 27 05/19/2022   BUN 11 05/19/2022   CREATININE 1.33 (H) 05/19/2022   GLUCOSE 132 (H) 05/19/2022   GFRNONAA >60 05/19/2022   GFRAA >60 08/29/2018   CALCIUM 8.8 (L) 05/19/2022   PHOS 3.0 05/19/2022   PROT 7.4 05/18/2022   ALBUMIN 3.3 (L) 05/18/2022   BILITOT 0.9 05/18/2022   ALKPHOS 67 05/18/2022   AST 39 05/18/2022   ALT 46 (H) 05/18/2022   ANIONGAP 10 05/19/2022    CBG (last 3)  No results for input(s): "GLUCAP" in the last 72 hours.    Coagulation Profile: No results for input(s): "INR", "PROTIME" in the last 168 hours.   Radiology Studies: I have personally reviewed the imaging studies  CT Angio Chest/Abd/Pel for Dissection W and/or W/WO  Result  Date: 05/18/2022 CLINICAL DATA:  Head and stomach pain since yesterday, hypertension EXAM: CT ANGIOGRAPHY CHEST, ABDOMEN AND PELVIS TECHNIQUE: Non-contrast CT of the chest was initially obtained. Multidetector CT imaging through the chest, abdomen and pelvis was performed using the standard protocol during bolus administration of intravenous contrast. Multiplanar reconstructed images and MIPs were obtained and reviewed to evaluate the vascular anatomy. RADIATION DOSE REDUCTION: This exam was performed according to the  departmental dose-optimization program which includes automated exposure control, adjustment of the mA and/or kV according to patient size and/or use of iterative reconstruction technique. CONTRAST:  46mL OMNIPAQUE IOHEXOL 350 MG/ML SOLN COMPARISON:  08/29/2018 FINDINGS: CTA CHEST FINDINGS Cardiovascular: No evidence of thoracic aortic aneurysm or dissection. Mild cardiomegaly without pericardial effusion. Atherosclerosis of the coronary vasculature. Mediastinum/Nodes: No enlarged mediastinal, hilar, or axillary lymph nodes. Thyroid gland, trachea, and esophagus demonstrate no significant findings. Lungs/Pleura: Trace bilateral pleural effusions, right greater than left. No airspace disease or pneumothorax. Central airways are patent. Musculoskeletal: There are no acute or destructive bony lesions. Reconstructed images demonstrate no additional findings. Review of the MIP images confirms the above findings. CTA ABDOMEN AND PELVIS FINDINGS VASCULAR Aorta: Normal caliber aorta without aneurysm, dissection, vasculitis or significant stenosis. Atherosclerosis of the distal aorta. Celiac: Patent without evidence of aneurysm, dissection, vasculitis or significant stenosis. SMA: Patent without evidence of aneurysm, dissection, vasculitis or significant stenosis. Renals: Both renal arteries are patent without evidence of aneurysm, dissection, vasculitis, fibromuscular dysplasia or significant stenosis. IMA:  Patent without evidence of aneurysm, dissection, vasculitis or significant stenosis. Inflow: Patent without evidence of aneurysm, dissection, vasculitis or significant stenosis. Veins: No obvious venous abnormality within the limitations of this arterial phase study. Review of the MIP images confirms the above findings. NON-VASCULAR Hepatobiliary: Nodularity of the liver capsule and hypertrophy of the left lobe liver consistent with cirrhosis. No focal parenchymal liver abnormality. Gallbladder is unremarkable. Pancreas: Unremarkable. No pancreatic ductal dilatation or surrounding inflammatory changes. Spleen: Normal in size without focal abnormality. Adrenals/Urinary Tract: Adrenal glands are unremarkable. Kidneys are normal, without renal calculi, focal lesion, or hydronephrosis. Bladder is unremarkable. Stomach/Bowel: No bowel obstruction or ileus. Normal appendix right lower quadrant. No bowel wall thickening or inflammatory change. Lymphatic: No pathologic adenopathy. Reproductive: Prostate is unremarkable. Other: No free fluid or free intraperitoneal gas. No abdominal wall hernia. Musculoskeletal: No acute or destructive bony lesions. Reconstructed images demonstrate no additional findings. Review of the MIP images confirms the above findings. IMPRESSION: 1. No evidence of thoracoabdominal aortic aneurysm or dissection. 2. Trace bilateral pleural effusions, right greater than left. 3. Nodular liver capsule and left lobe hypertrophy compatible with cirrhosis. 4.  Aortic Atherosclerosis (ICD10-I70.0). Electronically Signed   By: Randa Ngo M.D.   On: 05/18/2022 20:39   CT Head Wo Contrast  Result Date: 05/18/2022 CLINICAL DATA:  Sudden severe headaches EXAM: CT HEAD WITHOUT CONTRAST TECHNIQUE: Contiguous axial images were obtained from the base of the skull through the vertex without intravenous contrast. RADIATION DOSE REDUCTION: This exam was performed according to the departmental dose-optimization  program which includes automated exposure control, adjustment of the mA and/or kV according to patient size and/or use of iterative reconstruction technique. COMPARISON:  10/01/2013 FINDINGS: Brain: No acute intracranial findings are seen. There are no signs of bleeding within the cranium. Ventricles are not dilated. There are multiple calcifications in both cerebral hemispheres with no significant interval change. There is no adjacent edema or mass effect. This finding may suggest presence of multiple healed granulomas or residual changes from some other inflammatory process such as cysticercosis. Vascular: Unremarkable. Skull: There is decreased in number of air cells in mastoids. No fracture is seen. Sinuses/Orbits: No acute findings are seen. Other: No significant interval changes are noted. IMPRESSION: No acute intracranial findings are seen in noncontrast CT brain. There are few scattered calcifications of varying sizes in both cerebral hemispheres with no interval change since 10/01/2013 suggesting residual change from previous inflammation. Electronically Signed  By: Ernie Avena M.D.   On: 05/18/2022 12:23       Kyleigh Nannini M.D. [facility] 05/19/2022, 11:28 AM  Available via Epic secure chat 7am-7pm After 7 pm, please refer to night coverage provider listed on amion.

## 2022-05-19 NOTE — ED Notes (Signed)
Checked patient cbg it was 35

## 2022-05-20 LAB — BASIC METABOLIC PANEL
Anion gap: 8 (ref 5–15)
BUN: 20 mg/dL (ref 6–20)
CO2: 25 mmol/L (ref 22–32)
Calcium: 8.9 mg/dL (ref 8.9–10.3)
Chloride: 103 mmol/L (ref 98–111)
Creatinine, Ser: 1.41 mg/dL — ABNORMAL HIGH (ref 0.61–1.24)
GFR, Estimated: 60 mL/min — ABNORMAL LOW (ref >60)
Glucose, Bld: 124 mg/dL — ABNORMAL HIGH (ref 70–99)
Potassium: 3.9 mmol/L (ref 3.5–5.1)
Sodium: 136 mmol/L (ref 135–145)

## 2022-05-20 LAB — GLUCOSE, CAPILLARY
Glucose-Capillary: 140 mg/dL — ABNORMAL HIGH (ref 70–99)
Glucose-Capillary: 142 mg/dL — ABNORMAL HIGH (ref 70–99)
Glucose-Capillary: 144 mg/dL — ABNORMAL HIGH (ref 70–99)
Glucose-Capillary: 144 mg/dL — ABNORMAL HIGH (ref 70–99)

## 2022-05-20 LAB — BRAIN NATRIURETIC PEPTIDE: B Natriuretic Peptide: 516 pg/mL — ABNORMAL HIGH (ref 0.0–100.0)

## 2022-05-20 MED ORDER — LISINOPRIL 20 MG PO TABS
20.0000 mg | ORAL_TABLET | Freq: Every day | ORAL | Status: DC
  Administered 2022-05-20 – 2022-05-21 (×2): 20 mg via ORAL
  Filled 2022-05-20 (×2): qty 1

## 2022-05-20 MED ORDER — CARVEDILOL 12.5 MG PO TABS
12.5000 mg | ORAL_TABLET | Freq: Two times a day (BID) | ORAL | Status: DC
  Administered 2022-05-20 – 2022-05-21 (×3): 12.5 mg via ORAL
  Filled 2022-05-20 (×3): qty 1

## 2022-05-20 MED ORDER — LIVING WELL WITH DIABETES BOOK - IN SPANISH
Freq: Once | Status: AC
  Filled 2022-05-20: qty 1

## 2022-05-20 NOTE — Care Management (Addendum)
  Transition of Care [facility]) Screening Note   Patient Details  Name: Khyler Razo-Amezquita Date of Birth: [birth year]   Transition of Care [facility] CM/SW Contact:    Gala Lewandowsky, RN Phone Number: 05/20/2022, 1:16 PM    Transition of Care Department [facility]) has reviewed the patient. Patient's PCP is @ the [employer] . Case Manager did call the office to schedule an appointment and they were closed for lunch- will call back. Case Manager is following for [facility] assistance for medication needs. Case Manager will continue to monitor patient advancement through interdisciplinary progression rounds. If new patient transition needs arise, please place a TOC consult.  1410 05-20-22 Case Manager called the [facility] and it has been five years since his  last visit. Appointment scheduled at the [facility] and information placed on the AVS.

## 2022-05-20 NOTE — Inpatient Diabetes Management (Signed)
Inpatient Diabetes Program Recommendations  AACE/ADA: New Consensus Statement on Inpatient Glycemic Control (2015)  Target Ranges:  Prepandial:   less than 140 mg/dL      Peak postprandial:   less than 180 mg/dL (1-2 hours)      Critically ill patients:  140 - 180 mg/dL   Lab Results  Component Value Date   GLUCAP 140 (H) 05/20/2022   HGBA1C 6.4 (H) 05/19/2022    Review of Glycemic Control  Diabetes history: type 2 Outpatient Diabetes medications: none Current orders for Inpatient glycemic control: Novolog 0-9 units TID  Inpatient Diabetes Program Recommendations:   Received consult for diabetes coordinator to talk with patient about diet and hgbA1C. Used STRATA with Lars Mage, interpreter [phone]. Told patient that his HgbA1C is 6.4% and that diagnosis of diabetes is a HgbA1C of 6.5%. Patient states that he eats many tortillas a day, eats fried foods and drinks 3 beers a day. He drinks about 7 sodas per day. Discussed cutting back to 1 per day and to drink diet soda. Encouraged him to drink water. He does work with concrete all day long. Reviewed the plate method for eating, given information on foods, needs to cut back on carbs and fried foods,ordered Living Well with Diabetes booklet in Spanish. Wife was in the room and had some questions about foods.   Will need to follow up with a PCP after discharge.   Smith Mince RN BSN CDE Diabetes Coordinator Pager: [phone]  8am-5pm

## 2022-05-20 NOTE — Plan of Care (Addendum)
Interpreter [phone] and [phone] (program was disconnected) RD consulted for nutrition education regarding diabetes.   Lab Results  Component Value Date   HGBA1C 6.4 (H) 05/19/2022    Reviewed pt's usual diet recall: Breakfast:10-12 tortillas with "all my favorite things"-meats, peppers, beans, sour cream, cheese, etc. Lunch: 3 burritos, different kinds Dinner: torta, sweet bread, or again multiple tortillas with his favorite ingredients Beverages: 7 coca-colas  RD provided "Carbohydrate Counting for People with Diabetes" and "Label Reading for Diabetes" handouts from the Academy of Nutrition and Dietetics in Spanish. Discussed different food groups and their effects on blood sugar, emphasizing carbohydrate-containing foods. Provided list of carbohydrates and recommended serving sizes of common foods. Discussed importance of controlled and consistent carbohydrate intake throughout the day. Provided examples of ways to balance meals/snacks and encouraged intake of high-fiber, whole grain complex carbohydrates. Reviewed diet recall and made suggestions for getting started. Recommend eliminating sugar sweetened beverages. Pt asked if he could have diet soda instead. Discussed that diet soda, water or seltzer water would be the best choice. Encouraged pt to cut back on portion sizes. Suggested cutting portion in half to get started and add more non-starchy vegetables to help aid in satiety. Pt and wife asked appropriate questions and demonstrate understanding. Teach back method used.  Wife also asked about fat and cholesterol. Pt's Total cholesterol 204, LDL:146. Discussed that increasing fiber in diet and reducing portion sizes with help with cholesterol as well. Encouraged use of olive oil or canola oil for cooking instead of butter.   Expect good compliance.  Body mass index is 30.85 kg/m. Pt meets criteria for obesity based on current BMI.  Current diet order is carb modified, patient is  consuming approximately 100% of meals at this time. Labs and medications reviewed. No further nutrition interventions warranted at this time. RD contact information provided. If additional nutrition issues arise, please re-consult RD.  Antony Salmon, MS, RD, LDN, CNSC See AMiON for contact information

## 2022-05-20 NOTE — Progress Notes (Signed)
[place], is a 51 y.o. male, DOB - September 21, 1970, [id number] Admit date - 05/18/2022    Outpatient Primary MD for the patient is Mack Hook, MD  LOS - 1  days  Chief Complaint  Patient presents with   Abdominal Pain   Headache       Brief summary   Patient is a 51 year old male with HTN, prediabetes, obesity, HLP, presented to ED due to headache and shortness of breath.  Patient reported that on Monday, 2 days prior to admission, he did not feel well, had headache, developed blurred vision, nausea but no vomiting.  No other localized weakness.  Subsequently he developed mild chest pain, central without any radiation.  Per patient, he had self discontinued his antihypertensives 2 years ago due to symptoms of fatigue and dizziness. He presented to ER, was found to have uncontrolled hypertension with SBP 237/139, heart rate of 101.  Patient received IV labetalol and hydralazine with resolution of the chest pain.  Troponins was noted to be elevated with BNP 1192.  Cardiology was consulted.  Patient was admitted for further work-up.  Assessment & Plan    Principal Problem:   Hypertensive emergency, chest pain -Patient presented with symptoms of headache, blurred vision, shortness of breath, with elevated BNP and troponins, SBP in 240s. -Patient received IV labetalol, hydralazine, IV Lasix -2D echo showed EF of 55 to 60%, no regional wall motion abnormalities, diastolic parameters indeterminate. -CTA showed no PE or aortic dissection, trace bilateral pleural effusion -BP elevated this morning, reviewed cardiology conditions, changed to Coreg 12.5 mg twice daily, lisinopril 20 mg daily, discontinued HCTZ   Active Problems:    Acute diastolic CHF (congestive heart failure) (HCC) -BNP 1192, has received IV Lasix.  Likely hypertension induced pulmonary edema -2D  echo showed EF of 55 to 60% -Currently, euvolemic, lungs clear, no further need for Lasix  Elevated troponins -Likely due to demand ischemia from hypertensive emergency    Hyperlipidemia -Cholesterol 204, HDL 44, LDL 146 -Placed on Crestor 20 mg daily     Prediabetes -Hemoglobin A1c 6.4 -Recommended diet and weight control, low-dose metformin 500 mg daily at discharge  -Appointment scheduled for outpatient follow-up with IM clinic    Cirrhosis of liver ([place]) -CT chest abdomen pelvis showed nodular liver capsule and left lobe hypertrophy compatible with cirrhosis.  Prior history of alcohol use. -Avoid alcohol, outpatient follow-up with PCP and GI    Hypokalemia -Replaced  Proteinuria, AKI -Creatinine trending up, 1.4, likely due to diuresis, with Lasix.  Was placed on HCTZ and lisinopril.   -UA > 300 proteinuria, will benefit from ACE/ARB, follow renal function -No further need for diuresis, HCTZ also discontinued, will follow creatinine in a.m.  Obesity Estimated body mass index is 30.85 kg/m as calculated from the following:   Height as of this encounter: 5\' 6"  (1.676 m).   Weight as of this encounter: 86.7 kg.  Code Status: full  DVT Prophylaxis:  enoxaparin (LOVENOX) injection 40 mg Start:  05/19/22 1000 SCDs Start: 05/18/22 2243   Level of Care: Level of care: Telemetry Medical Family Communication: Updated patient   Disposition Plan:      Remains inpatient appropriate: Work-up In progress, likely DC home in a.m. if BP and creatinine improving  Procedures:  2D echo  Consultants:   Cardiology Antimicrobials: none     Medications  aspirin EC  81 mg Oral Daily   carvedilol  12.5 mg Oral BID WC   enoxaparin (LOVENOX) injection  40 mg Subcutaneous Q24H   insulin aspart  0-9 Units Subcutaneous TID WC   lisinopril  20 mg Oral Daily   rosuvastatin  20 mg Oral Daily      Subjective:   Austin Moreno was seen and examined today.  BP still not well  controlled.   No chest pain, no shortness of breath,  abdominal pain.   Objective:   Vitals:   05/19/22 2028 05/19/22 2326 05/20/22 0006 05/20/22 0551  BP: (!) 151/94  (!) 156/106 (!) 148/104  Pulse: 92  73 72  Resp:   17 17  Temp:   98.6 F (37 C) 98.3 F (36.8 C)  TempSrc:   Oral Oral  SpO2:   98% 99%  Weight:  86.7 kg    Height:  5\' 6"  (1.676 m)      Intake/Output Summary (Last 24 hours) at 05/20/2022 1432 Last data filed at 05/19/2022 2028 Gross per 24 hour  Intake 240 ml  Output --  Net 240 ml     Wt Readings from Last 3 Encounters:  05/19/22 86.7 kg  02/09/17 93.4 kg  11/10/16 92.5 kg    Physical Exam General: Alert and oriented x 3, NAD Cardiovascular: S1 S2 clear, RRR.  Respiratory: CTAB, no wheezing, rales  Gastrointestinal: Soft, nontender, nondistended, NBS Ext: no pedal edema bilaterally Neuro: no new deficits Psych: Normal affect     Data Reviewed:  I have personally reviewed following labs    CBC Lab Results  Component Value Date   WBC 9.7 05/19/2022   RBC 4.23 05/19/2022   HGB 12.6 (L) 05/19/2022   HCT 36.8 (L) 05/19/2022   MCV 87.0 05/19/2022   MCH 29.8 05/19/2022   PLT 249 05/19/2022   MCHC 34.2 05/19/2022   RDW 13.6 05/19/2022   LYMPHSABS 1.8 05/18/2022   MONOABS 0.7 05/18/2022   EOSABS 0.1 05/18/2022   BASOSABS 0.1 05/18/2022     Last metabolic panel Lab Results  Component Value Date   NA 136 05/20/2022   K 3.9 05/20/2022   CL 103 05/20/2022   CO2 25 05/20/2022   BUN 20 05/20/2022   CREATININE 1.41 (H) 05/20/2022   GLUCOSE 124 (H) 05/20/2022   GFRNONAA 60 (L) 05/20/2022   GFRAA >60 08/29/2018   CALCIUM 8.9 05/20/2022   PHOS 3.0 05/19/2022   PROT 7.4 05/18/2022   ALBUMIN 3.3 (L) 05/18/2022   BILITOT 0.9 05/18/2022   ALKPHOS 67 05/18/2022   AST 39 05/18/2022   ALT 46 (H) 05/18/2022   ANIONGAP 8 05/20/2022    CBG (last 3)  Recent Labs    05/19/22 2126 05/20/22 0730 05/20/22 1203  GLUCAP 155* 140* 142*       Coagulation Profile: No results for input(s): "INR", "PROTIME" in the last 168 hours.   Radiology Studies: I have personally reviewed the imaging studies  ECHOCARDIOGRAM COMPLETE  Result Date: 05/19/2022    ECHOCARDIOGRAM REPORT   Patient Name:   Austin Moreno Date of Exam: 05/19/2022 Medical Rec #:  [id number]           Height:       62.0 in Accession #:    [id number]          Weight:       206.0 lb Date of Birth:  [birth year]           BSA:          1.936 m Patient Age:    40 years            BP:           142/89 mmHg Patient Gender: M                   HR:           79 bpm. Exam Location:  Inpatient Procedure: 2D Echo, Cardiac Doppler, Color Doppler and Intracardiac            Opacification Agent Indications:    Chest pain  History:        Patient has no prior history of Echocardiogram examinations.                 Risk Factors:Hypertension and Dyslipidemia.  Sonographer:    Clayton Lefort RDCS (AE) Referring Phys: [id number] BELAL A SULEIMAN  Sonographer Comments: Suboptimal apical window. IMPRESSIONS  1. Left ventricular ejection fraction, by estimation, is 55 to 60%. The left ventricle has normal function. The left ventricle has no regional wall motion abnormalities. There is mild left ventricular hypertrophy. Left ventricular diastolic parameters are indeterminate.  2. Right ventricular systolic function is normal. The right ventricular size is normal.  3. Mild mitral valve regurgitation. Moderate mitral annular calcification.  4. The aortic valve is tricuspid. Aortic valve regurgitation is not visualized.  5. The inferior vena cava is dilated in size with <50% respiratory variability, suggesting right atrial pressure of 15 mmHg. FINDINGS  Left Ventricle: Left ventricular ejection fraction, by estimation, is 55 to 60%. The left ventricle has normal function. The left ventricle has no regional wall motion abnormalities. Definity contrast agent was given IV to delineate the left ventricular   endocardial borders. The left ventricular internal cavity size was normal in size. There is mild left ventricular hypertrophy. Left ventricular diastolic parameters are indeterminate. Right Ventricle: The right ventricular size is normal. Right vetricular wall thickness was not assessed. Right ventricular systolic function is normal. Left Atrium: Left atrial size was normal in size. Right Atrium: Right atrial size was normal in size. Pericardium: There is no evidence of pericardial effusion. Mitral Valve: There is mild thickening of the mitral valve leaflet(s). Moderate mitral annular calcification. Mild mitral valve regurgitation. MV peak gradient, 12.8 mmHg. The mean mitral valve gradient is 4.0 mmHg. Tricuspid Valve: The tricuspid valve is normal in structure. Tricuspid valve regurgitation is trivial. Aortic Valve: The aortic valve is tricuspid. Aortic valve regurgitation is not visualized. Aortic valve mean gradient measures 3.0 mmHg. Aortic valve peak gradient measures 6.6 mmHg. Aortic valve area, by VTI measures 2.62 cm. Pulmonic Valve: The pulmonic valve was grossly normal. Pulmonic valve regurgitation is not visualized. No evidence of pulmonic stenosis. Aorta: The aortic root is normal in size and structure. Venous: The inferior vena cava is dilated in size with less than 50% respiratory variability, suggesting right atrial pressure of 15 mmHg. IAS/Shunts: No atrial level shunt detected by color flow Doppler.  LEFT VENTRICLE PLAX 2D LVIDd:         4.50 cm   Diastology LVIDs:  3.10 cm   LV e' medial:    4.13 cm/s LV PW:         1.30 cm   LV E/e' medial:  42.1 LV IVS:        1.50 cm   LV e' lateral:   8.27 cm/s LVOT diam:     2.10 cm   LV E/e' lateral: 21.0 LV SV:         59 LV SV Index:   30 LVOT Area:     3.46 cm  RIGHT VENTRICLE             IVC RV Basal diam:  3.30 cm     IVC diam: 2.60 cm RV S prime:     13.30 cm/s TAPSE (M-mode): 1.2 cm LEFT ATRIUM             Index        RIGHT ATRIUM            Index LA diam:        4.30 cm 2.22 cm/m   RA Area:     19.50 cm LA Vol (A2C):   75.7 ml 39.10 ml/m  RA Volume:   54.00 ml  27.89 ml/m LA Vol (A4C):   74.6 ml 38.54 ml/m LA Biplane Vol: 79.5 ml 41.07 ml/m  AORTIC VALVE AV Area (Vmax):    2.58 cm AV Area (Vmean):   2.60 cm AV Area (VTI):     2.62 cm AV Vmax:           128.00 cm/s AV Vmean:          82.600 cm/s AV VTI:            0.223 m AV Peak Grad:      6.6 mmHg AV Mean Grad:      3.0 mmHg LVOT Vmax:         95.50 cm/s LVOT Vmean:        61.900 cm/s LVOT VTI:          0.169 m LVOT/AV VTI ratio: 0.76  AORTA Ao Root diam: 3.20 cm MITRAL VALVE MV Area (PHT): 3.79 cm     SHUNTS MV Area VTI:   1.61 cm     Systemic VTI:  0.17 m MV Peak grad:  12.8 mmHg    Systemic Diam: 2.10 cm MV Mean grad:  4.0 mmHg MV Vmax:       1.79 m/s MV Vmean:      92.3 cm/s MV Decel Time: 200 msec MV E velocity: 174.00 cm/s MV A velocity: 53.10 cm/s MV E/A ratio:  3.28 Dorris Carnes MD Electronically signed by Dorris Carnes MD Signature Date/Time: 05/19/2022/12:40:27 PM    Final    CT Angio Chest/Abd/Pel for Dissection W and/or W/WO  Result Date: 05/18/2022 CLINICAL DATA:  Head and stomach pain since yesterday, hypertension EXAM: CT ANGIOGRAPHY CHEST, ABDOMEN AND PELVIS TECHNIQUE: Non-contrast CT of the chest was initially obtained. Multidetector CT imaging through the chest, abdomen and pelvis was performed using the standard protocol during bolus administration of intravenous contrast. Multiplanar reconstructed images and MIPs were obtained and reviewed to evaluate the vascular anatomy. RADIATION DOSE REDUCTION: This exam was performed according to the departmental dose-optimization program which includes automated exposure control, adjustment of the mA and/or kV according to patient size and/or use of iterative reconstruction technique. CONTRAST:  23mL OMNIPAQUE IOHEXOL 350 MG/ML SOLN COMPARISON:  08/29/2018 FINDINGS: CTA CHEST FINDINGS Cardiovascular: No evidence of thoracic aortic  aneurysm or  dissection. Mild cardiomegaly without pericardial effusion. Atherosclerosis of the coronary vasculature. Mediastinum/Nodes: No enlarged mediastinal, hilar, or axillary lymph nodes. Thyroid gland, trachea, and esophagus demonstrate no significant findings. Lungs/Pleura: Trace bilateral pleural effusions, right greater than left. No airspace disease or pneumothorax. Central airways are patent. Musculoskeletal: There are no acute or destructive bony lesions. Reconstructed images demonstrate no additional findings. Review of the MIP images confirms the above findings. CTA ABDOMEN AND PELVIS FINDINGS VASCULAR Aorta: Normal caliber aorta without aneurysm, dissection, vasculitis or significant stenosis. Atherosclerosis of the distal aorta. Celiac: Patent without evidence of aneurysm, dissection, vasculitis or significant stenosis. SMA: Patent without evidence of aneurysm, dissection, vasculitis or significant stenosis. Renals: Both renal arteries are patent without evidence of aneurysm, dissection, vasculitis, fibromuscular dysplasia or significant stenosis. IMA: Patent without evidence of aneurysm, dissection, vasculitis or significant stenosis. Inflow: Patent without evidence of aneurysm, dissection, vasculitis or significant stenosis. Veins: No obvious venous abnormality within the limitations of this arterial phase study. Review of the MIP images confirms the above findings. NON-VASCULAR Hepatobiliary: Nodularity of the liver capsule and hypertrophy of the left lobe liver consistent with cirrhosis. No focal parenchymal liver abnormality. Gallbladder is unremarkable. Pancreas: Unremarkable. No pancreatic ductal dilatation or surrounding inflammatory changes. Spleen: Normal in size without focal abnormality. Adrenals/Urinary Tract: Adrenal glands are unremarkable. Kidneys are normal, without renal calculi, focal lesion, or hydronephrosis. Bladder is unremarkable. Stomach/Bowel: No bowel obstruction or ileus.  Normal appendix right lower quadrant. No bowel wall thickening or inflammatory change. Lymphatic: No pathologic adenopathy. Reproductive: Prostate is unremarkable. Other: No free fluid or free intraperitoneal gas. No abdominal wall hernia. Musculoskeletal: No acute or destructive bony lesions. Reconstructed images demonstrate no additional findings. Review of the MIP images confirms the above findings. IMPRESSION: 1. No evidence of thoracoabdominal aortic aneurysm or dissection. 2. Trace bilateral pleural effusions, right greater than left. 3. Nodular liver capsule and left lobe hypertrophy compatible with cirrhosis. 4.  Aortic Atherosclerosis (ICD10-I70.0). Electronically Signed   By: Randa Ngo M.D.   On: 05/18/2022 20:39       Korion Cuevas M.D. [facility] 05/20/2022, 2:32 PM  Available via Epic secure chat 7am-7pm After 7 pm, please refer to night coverage provider listed on amion.

## 2022-05-20 NOTE — Progress Notes (Signed)
Rounding Note    Patient Name: Austin Moreno Date of Encounter: 05/20/2022  [facility] Cardiologist: New  Subjective   No CP or dyspnea  Inpatient Medications    Scheduled Meds:  aspirin EC  81 mg Oral Daily   enoxaparin (LOVENOX) injection  40 mg Subcutaneous Q24H   hydrochlorothiazide  25 mg Oral Daily   influenza vac split quadrivalent PF  0.5 mL Intramuscular Tomorrow-1000   insulin aspart  0-9 Units Subcutaneous TID WC   lisinopril  10 mg Oral Daily   metoprolol tartrate  25 mg Oral BID   pneumococcal 20-valent conjugate vaccine  0.5 mL Intramuscular Tomorrow-1000   rosuvastatin  20 mg Oral Daily   Continuous Infusions:  PRN Meds: acetaminophen **OR** acetaminophen, metoprolol tartrate, ondansetron **OR** ondansetron (ZOFRAN) IV, senna-docusate   Vital Signs    Vitals:   05/19/22 2028 05/19/22 2326 05/20/22 0006 05/20/22 0551  BP: (!) 151/94  (!) 156/106 (!) 148/104  Pulse: 92  73 72  Resp:   17 17  Temp:   98.6 F (37 C) 98.3 F (36.8 C)  TempSrc:   Oral Oral  SpO2:   98% 99%  Weight:  86.7 kg    Height:  5\' 6"  (1.676 m)      Intake/Output Summary (Last 24 hours) at 05/20/2022 0746 Last data filed at 05/19/2022 2028 Gross per 24 hour  Intake 240 ml  Output --  Net 240 ml      05/19/2022   11:26 PM 02/09/2017    8:31 AM 11/10/2016    8:41 AM  Last 3 Weights  Weight (lbs) 191 lb 2.2 oz 206 lb 204 lb  Weight (kg) 86.7 kg 93.441 kg 92.534 kg      Telemetry    sinus with 3 beats NSVT- Personally Reviewed   Physical Exam   GEN: No acute distress.   Neck: No JVD Cardiac: RRR, no murmurs, rubs, or gallops.  Respiratory: Clear to auscultation bilaterally. GI: Soft, nontender, non-distended  MS: No edema Neuro:  Nonfocal  Psych: Normal affect   Labs    High Sensitivity Troponin:   Recent Labs  Lab 05/18/22 1700 05/18/22 1910  TROPONINIHS 102* 101*     Chemistry Recent Labs  Lab 05/18/22 1144 05/18/22 2315  05/19/22 0356 05/20/22 0312  NA 136  --  135 136  K 3.0*  --  3.2* 3.9  CL 99  --  98 103  CO2 26  --  27 25  GLUCOSE 122*  --  132* 124*  BUN 8  --  11 20  CREATININE 1.22 1.24 1.33* 1.41*  CALCIUM 8.4*  --  8.8* 8.9  MG  --   --  2.2  --   PROT 7.4  --   --   --   ALBUMIN 3.3*  --   --   --   AST 39  --   --   --   ALT 46*  --   --   --   ALKPHOS 67  --   --   --   BILITOT 0.9  --   --   --   GFRNONAA >60 >60 >60 60*  ANIONGAP 11  --  10 8    Lipids  Recent Labs  Lab 05/18/22 2315  CHOL 204*  TRIG 69  HDL 44  LDLCALC 146*  CHOLHDL 4.6    Hematology Recent Labs  Lab 05/18/22 1144 05/18/22 2315 05/19/22 0356  WBC 9.8 10.7* 9.7  RBC 4.36 4.27 4.23  HGB 13.1 12.7* 12.6*  HCT 37.8* 36.9* 36.8*  MCV 86.7 86.4 87.0  MCH 30.0 29.7 29.8  MCHC 34.7 34.4 34.2  RDW 13.5 13.6 13.6  PLT 264 293 249    BNP Recent Labs  Lab 05/18/22 2206 05/20/22 0312  BNP 1,192.0* 516.0*      Radiology    ECHOCARDIOGRAM COMPLETE  Result Date: 05/19/2022    ECHOCARDIOGRAM REPORT   Patient Name:   Austin Moreno Date of Exam: 05/19/2022 Medical Rec #:  [id number]           Height:       62.0 in Accession #:    [id number]          Weight:       206.0 lb Date of Birth:  04/27/1971           BSA:          1.936 m Patient Age:    51 years            BP:           142/89 mmHg Patient Gender: M                   HR:           79 bpm. Exam Location:  Inpatient Procedure: 2D Echo, Cardiac Doppler, Color Doppler and Intracardiac            Opacification Agent Indications:    Chest pain  History:        Patient has no prior history of Echocardiogram examinations.                 Risk Factors:Hypertension and Dyslipidemia.  Sonographer:    Ross LudwigArthur Guy RDCS (AE) Referring Phys: [id number] BELAL A SULEIMAN  Sonographer Comments: Suboptimal apical window. IMPRESSIONS  1. Left ventricular ejection fraction, by estimation, is 55 to 60%. The left ventricle has normal function. The left ventricle has no  regional wall motion abnormalities. There is mild left ventricular hypertrophy. Left ventricular diastolic parameters are indeterminate.  2. Right ventricular systolic function is normal. The right ventricular size is normal.  3. Mild mitral valve regurgitation. Moderate mitral annular calcification.  4. The aortic valve is tricuspid. Aortic valve regurgitation is not visualized.  5. The inferior vena cava is dilated in size with <50% respiratory variability, suggesting right atrial pressure of 15 mmHg. FINDINGS  Left Ventricle: Left ventricular ejection fraction, by estimation, is 55 to 60%. The left ventricle has normal function. The left ventricle has no regional wall motion abnormalities. Definity contrast agent was given IV to delineate the left ventricular  endocardial borders. The left ventricular internal cavity size was normal in size. There is mild left ventricular hypertrophy. Left ventricular diastolic parameters are indeterminate. Right Ventricle: The right ventricular size is normal. Right vetricular wall thickness was not assessed. Right ventricular systolic function is normal. Left Atrium: Left atrial size was normal in size. Right Atrium: Right atrial size was normal in size. Pericardium: There is no evidence of pericardial effusion. Mitral Valve: There is mild thickening of the mitral valve leaflet(s). Moderate mitral annular calcification. Mild mitral valve regurgitation. MV peak gradient, 12.8 mmHg. The mean mitral valve gradient is 4.0 mmHg. Tricuspid Valve: The tricuspid valve is normal in structure. Tricuspid valve regurgitation is trivial. Aortic Valve: The aortic valve is tricuspid. Aortic valve regurgitation is not visualized. Aortic valve mean gradient measures 3.0 mmHg. Aortic valve peak gradient measures 6.6  mmHg. Aortic valve area, by VTI measures 2.62 cm. Pulmonic Valve: The pulmonic valve was grossly normal. Pulmonic valve regurgitation is not visualized. No evidence of pulmonic  stenosis. Aorta: The aortic root is normal in size and structure. Venous: The inferior vena cava is dilated in size with less than 50% respiratory variability, suggesting right atrial pressure of 15 mmHg. IAS/Shunts: No atrial level shunt detected by color flow Doppler.  LEFT VENTRICLE PLAX 2D LVIDd:         4.50 cm   Diastology LVIDs:         3.10 cm   LV e' medial:    4.13 cm/s LV PW:         1.30 cm   LV E/e' medial:  42.1 LV IVS:        1.50 cm   LV e' lateral:   8.27 cm/s LVOT diam:     2.10 cm   LV E/e' lateral: 21.0 LV SV:         59 LV SV Index:   30 LVOT Area:     3.46 cm  RIGHT VENTRICLE             IVC RV Basal diam:  3.30 cm     IVC diam: 2.60 cm RV S prime:     13.30 cm/s TAPSE (M-mode): 1.2 cm LEFT ATRIUM             Index        RIGHT ATRIUM           Index LA diam:        4.30 cm 2.22 cm/m   RA Area:     19.50 cm LA Vol (A2C):   75.7 ml 39.10 ml/m  RA Volume:   54.00 ml  27.89 ml/m LA Vol (A4C):   74.6 ml 38.54 ml/m LA Biplane Vol: 79.5 ml 41.07 ml/m  AORTIC VALVE AV Area (Vmax):    2.58 cm AV Area (Vmean):   2.60 cm AV Area (VTI):     2.62 cm AV Vmax:           128.00 cm/s AV Vmean:          82.600 cm/s AV VTI:            0.223 m AV Peak Grad:      6.6 mmHg AV Mean Grad:      3.0 mmHg LVOT Vmax:         95.50 cm/s LVOT Vmean:        61.900 cm/s LVOT VTI:          0.169 m LVOT/AV VTI ratio: 0.76  AORTA Ao Root diam: 3.20 cm MITRAL VALVE MV Area (PHT): 3.79 cm     SHUNTS MV Area VTI:   1.61 cm     Systemic VTI:  0.17 m MV Peak grad:  12.8 mmHg    Systemic Diam: 2.10 cm MV Mean grad:  4.0 mmHg MV Vmax:       1.79 m/s MV Vmean:      92.3 cm/s MV Decel Time: 200 msec MV E velocity: 174.00 cm/s MV A velocity: 53.10 cm/s MV E/A ratio:  3.28 Dorris Carnes MD Electronically signed by Dorris Carnes MD Signature Date/Time: 05/19/2022/12:40:27 PM    Final    CT Angio Chest/Abd/Pel for Dissection W and/or W/WO  Result Date: 05/18/2022 CLINICAL DATA:  Head and stomach pain since yesterday, hypertension  EXAM: CT ANGIOGRAPHY CHEST, ABDOMEN AND PELVIS TECHNIQUE: Non-contrast CT  of the chest was initially obtained. Multidetector CT imaging through the chest, abdomen and pelvis was performed using the standard protocol during bolus administration of intravenous contrast. Multiplanar reconstructed images and MIPs were obtained and reviewed to evaluate the vascular anatomy. RADIATION DOSE REDUCTION: This exam was performed according to the departmental dose-optimization program which includes automated exposure control, adjustment of the mA and/or kV according to patient size and/or use of iterative reconstruction technique. CONTRAST:  16mL OMNIPAQUE IOHEXOL 350 MG/ML SOLN COMPARISON:  08/29/2018 FINDINGS: CTA CHEST FINDINGS Cardiovascular: No evidence of thoracic aortic aneurysm or dissection. Mild cardiomegaly without pericardial effusion. Atherosclerosis of the coronary vasculature. Mediastinum/Nodes: No enlarged mediastinal, hilar, or axillary lymph nodes. Thyroid gland, trachea, and esophagus demonstrate no significant findings. Lungs/Pleura: Trace bilateral pleural effusions, right greater than left. No airspace disease or pneumothorax. Central airways are patent. Musculoskeletal: There are no acute or destructive bony lesions. Reconstructed images demonstrate no additional findings. Review of the MIP images confirms the above findings. CTA ABDOMEN AND PELVIS FINDINGS VASCULAR Aorta: Normal caliber aorta without aneurysm, dissection, vasculitis or significant stenosis. Atherosclerosis of the distal aorta. Celiac: Patent without evidence of aneurysm, dissection, vasculitis or significant stenosis. SMA: Patent without evidence of aneurysm, dissection, vasculitis or significant stenosis. Renals: Both renal arteries are patent without evidence of aneurysm, dissection, vasculitis, fibromuscular dysplasia or significant stenosis. IMA: Patent without evidence of aneurysm, dissection, vasculitis or significant stenosis.  Inflow: Patent without evidence of aneurysm, dissection, vasculitis or significant stenosis. Veins: No obvious venous abnormality within the limitations of this arterial phase study. Review of the MIP images confirms the above findings. NON-VASCULAR Hepatobiliary: Nodularity of the liver capsule and hypertrophy of the left lobe liver consistent with cirrhosis. No focal parenchymal liver abnormality. Gallbladder is unremarkable. Pancreas: Unremarkable. No pancreatic ductal dilatation or surrounding inflammatory changes. Spleen: Normal in size without focal abnormality. Adrenals/Urinary Tract: Adrenal glands are unremarkable. Kidneys are normal, without renal calculi, focal lesion, or hydronephrosis. Bladder is unremarkable. Stomach/Bowel: No bowel obstruction or ileus. Normal appendix right lower quadrant. No bowel wall thickening or inflammatory change. Lymphatic: No pathologic adenopathy. Reproductive: Prostate is unremarkable. Other: No free fluid or free intraperitoneal gas. No abdominal wall hernia. Musculoskeletal: No acute or destructive bony lesions. Reconstructed images demonstrate no additional findings. Review of the MIP images confirms the above findings. IMPRESSION: 1. No evidence of thoracoabdominal aortic aneurysm or dissection. 2. Trace bilateral pleural effusions, right greater than left. 3. Nodular liver capsule and left lobe hypertrophy compatible with cirrhosis. 4.  Aortic Atherosclerosis (ICD10-I70.0). Electronically Signed   By: Randa Ngo M.D.   On: 05/18/2022 20:39   CT Head Wo Contrast  Result Date: 05/18/2022 CLINICAL DATA:  Sudden severe headaches EXAM: CT HEAD WITHOUT CONTRAST TECHNIQUE: Contiguous axial images were obtained from the base of the skull through the vertex without intravenous contrast. RADIATION DOSE REDUCTION: This exam was performed according to the departmental dose-optimization program which includes automated exposure control, adjustment of the mA and/or kV  according to patient size and/or use of iterative reconstruction technique. COMPARISON:  10/01/2013 FINDINGS: Brain: No acute intracranial findings are seen. There are no signs of bleeding within the cranium. Ventricles are not dilated. There are multiple calcifications in both cerebral hemispheres with no significant interval change. There is no adjacent edema or mass effect. This finding may suggest presence of multiple healed granulomas or residual changes from some other inflammatory process such as cysticercosis. Vascular: Unremarkable. Skull: There is decreased in number of air cells in mastoids. No fracture is  seen. Sinuses/Orbits: No acute findings are seen. Other: No significant interval changes are noted. IMPRESSION: No acute intracranial findings are seen in noncontrast CT brain. There are few scattered calcifications of varying sizes in both cerebral hemispheres with no interval change since 10/01/2013 suggesting residual change from previous inflammation. Electronically Signed   By: Ernie Avena M.D.   On: 05/18/2022 12:23    Patient Profile     51 y.o. male with PMH of HTN and HLD who self-discontinued anti-hypertensive meds 2 years ago due to symptoms of fatigue and presyncope came in with dyspnea, epigastric pain and hypertensive emergency with trop leak. BP on arrival 237/139.  Echocardiogram showed normal LV function, mild left ventricular hypertrophy.  Assessment & Plan    1 hypertensive emergency-blood pressure remains elevated.  Creatinine mildly increased and patient noted to have hypokalemia at time of admission.  Discontinue HCTZ.  Will increase lisinopril to 20 mg daily.  Discontinue metoprolol and treat with carvedilol 12.5 mg twice daily.  We will continue to titrate medications to control blood pressure.  Note LV function normal.  2 mildly elevated troponin-no clear trend and patient denies chest pain.  No plans for further ischemia evaluation.  3 hyperlipidemia-statin  has been initiated.  Patient needs lipids and liver in 8 weeks.  4 elevated hemoglobin A1c-Per primary care.  5 acute kidney injury-creatinine minimally increased.  Discontinue HCTZ and follow.  Note patient did receive contrast for CTA at time of admission.  Possible contribution from contrast nephropathy.  For questions or updates, please contact [facility] Please consult [email or web address] for contact info under        Signed, Olga Millers, MD  05/20/2022, 7:46 AM

## 2022-05-21 DIAGNOSIS — R7303 Prediabetes: Secondary | ICD-10-CM

## 2022-05-21 LAB — BASIC METABOLIC PANEL
Anion gap: 8 (ref 5–15)
BUN: 27 mg/dL — ABNORMAL HIGH (ref 6–20)
CO2: 26 mmol/L (ref 22–32)
Calcium: 9 mg/dL (ref 8.9–10.3)
Chloride: 102 mmol/L (ref 98–111)
Creatinine, Ser: 1.54 mg/dL — ABNORMAL HIGH (ref 0.61–1.24)
GFR, Estimated: 54 mL/min — ABNORMAL LOW (ref >60)
Glucose, Bld: 122 mg/dL — ABNORMAL HIGH (ref 70–99)
Potassium: 4.6 mmol/L (ref 3.5–5.1)
Sodium: 136 mmol/L (ref 135–145)

## 2022-05-21 LAB — GLUCOSE, CAPILLARY: Glucose-Capillary: 118 mg/dL — ABNORMAL HIGH (ref 70–99)

## 2022-05-21 MED ORDER — AMLODIPINE BESYLATE 5 MG PO TABS
5.0000 mg | ORAL_TABLET | Freq: Every day | ORAL | Status: DC
  Administered 2022-05-21: 5 mg via ORAL
  Filled 2022-05-21: qty 1

## 2022-05-21 MED ORDER — ASPIRIN 81 MG PO TBEC: 81.0000 mg | DELAYED_RELEASE_TABLET | Freq: Every day | ORAL | 12 refills | Status: DC

## 2022-05-21 MED ORDER — AMLODIPINE BESYLATE 5 MG PO TABS: 5.0000 mg | ORAL_TABLET | Freq: Every day | ORAL | 3 refills | Status: DC

## 2022-05-21 MED ORDER — CARVEDILOL 12.5 MG PO TABS: 12.5000 mg | ORAL_TABLET | Freq: Two times a day (BID) | ORAL | 3 refills | Status: AC

## 2022-05-21 MED ORDER — ROSUVASTATIN CALCIUM 20 MG PO TABS: 20.0000 mg | ORAL_TABLET | Freq: Every day | ORAL | 3 refills | Status: AC

## 2022-05-21 MED ORDER — LISINOPRIL 20 MG PO TABS: 20.0000 mg | ORAL_TABLET | Freq: Every day | ORAL | 3 refills | Status: DC

## 2022-05-21 NOTE — Care Management (Signed)
05-21-22 3013 MATCH Letter completed for medication assistance- staff RN provided education to the patient. MATCH letter to be given to the [facility]. No further needs identified at this time.

## 2022-05-21 NOTE — Progress Notes (Signed)
Patient D/C home with family, self care. Discharge instructions and medications List along with medication assistance documents provided and explained using [employer] with [facility] # [phone]. Questions answered and concerns addressed. Patient/spouse verbalized understanding. Transferred via via wheelchair; alert, oriented at baseline. Mikal Plane, BSN,RN

## 2022-05-21 NOTE — Progress Notes (Signed)
Rounding Note    Patient Name: Austin Moreno Date of Encounter: 05/21/2022  [facility] Cardiologist: New  Subjective   Pt denies CP or dyspnea  Inpatient Medications    Scheduled Meds:  aspirin EC  81 mg Oral Daily   carvedilol  12.5 mg Oral BID WC   enoxaparin (LOVENOX) injection  40 mg Subcutaneous Q24H   insulin aspart  0-9 Units Subcutaneous TID WC   lisinopril  20 mg Oral Daily   rosuvastatin  20 mg Oral Daily   Continuous Infusions:  PRN Meds: acetaminophen **OR** acetaminophen, metoprolol tartrate, ondansetron **OR** ondansetron (ZOFRAN) IV, senna-docusate   Vital Signs    Vitals:   05/20/22 0730 05/20/22 1442 05/20/22 2029 05/21/22 0527  BP: (!) 155/109 (!) 141/94 (!) 147/101 (!) 179/109  Pulse:  77 78 80  Resp:  15 16 17   Temp:  98.3 F (36.8 C) 99 F (37.2 C) 98 F (36.7 C)  TempSrc:  Oral Oral Oral  SpO2:  100% 95% 95%  Weight:      Height:       No intake or output data in the 24 hours ending 05/21/22 0612     05/19/2022   11:26 PM 02/09/2017    8:31 AM 11/10/2016    8:41 AM  Last 3 Weights  Weight (lbs) 191 lb 2.2 oz 206 lb 204 lb  Weight (kg) 86.7 kg 93.441 kg 92.534 kg      Telemetry    Sinus with couplet and 3 beats NSVT- Personally Reviewed   Physical Exam   GEN: NAD Neck: Supple Cardiac: RRR Respiratory: CTA GI: Soft, NT/ND MS: No edema Neuro:  Grossly intact Psych: Normal affect   Labs    High Sensitivity Troponin:   Recent Labs  Lab 05/18/22 1700 05/18/22 1910  TROPONINIHS 102* 101*      Chemistry Recent Labs  Lab 05/18/22 1144 05/18/22 2315 05/19/22 0356 05/20/22 0312 05/21/22 0142  NA 136  --  135 136 136  K 3.0*  --  3.2* 3.9 4.6  CL 99  --  98 103 102  CO2 26  --  27 25 26   GLUCOSE 122*  --  132* 124* 122*  BUN 8  --  11 20 27*  CREATININE 1.22   < > 1.33* 1.41* 1.54*  CALCIUM 8.4*  --  8.8* 8.9 9.0  MG  --   --  2.2  --   --   PROT 7.4  --   --   --   --   ALBUMIN 3.3*  --    --   --   --   AST 39  --   --   --   --   ALT 46*  --   --   --   --   ALKPHOS 67  --   --   --   --   BILITOT 0.9  --   --   --   --   GFRNONAA >60   < > >60 60* 54*  ANIONGAP 11  --  10 8 8    < > = values in this interval not displayed.     Lipids  Recent Labs  Lab 05/18/22 2315  CHOL 204*  TRIG 69  HDL 44  LDLCALC 146*  CHOLHDL 4.6     Hematology Recent Labs  Lab 05/18/22 1144 05/18/22 2315 05/19/22 0356  WBC 9.8 10.7* 9.7  RBC 4.36 4.27 4.23  HGB 13.1 12.7* 12.6*  HCT 37.8* 36.9* 36.8*  MCV 86.7 86.4 87.0  MCH 30.0 29.7 29.8  MCHC 34.7 34.4 34.2  RDW 13.5 13.6 13.6  PLT 264 293 249     BNP Recent Labs  Lab 05/18/22 2206 05/20/22 0312  BNP 1,192.0* 516.0*       Radiology    ECHOCARDIOGRAM COMPLETE  Result Date: 05/19/2022    ECHOCARDIOGRAM REPORT   Patient Name:   CHONG WOJDYLA Date of Exam: 05/19/2022 Medical Rec #:  [id number]           Height:       62.0 in Accession #:    [id number]          Weight:       206.0 lb Date of Birth:  August 13, 1970           BSA:          1.936 m Patient Age:    51 years            BP:           142/89 mmHg Patient Gender: M                   HR:           79 bpm. Exam Location:  Inpatient Procedure: 2D Echo, Cardiac Doppler, Color Doppler and Intracardiac            Opacification Agent Indications:    Chest pain  History:        Patient has no prior history of Echocardiogram examinations.                 Risk Factors:Hypertension and Dyslipidemia.  Sonographer:    Ross Ludwig RDCS (AE) Referring Phys: [id number] BELAL A SULEIMAN  Sonographer Comments: Suboptimal apical window. IMPRESSIONS  1. Left ventricular ejection fraction, by estimation, is 55 to 60%. The left ventricle has normal function. The left ventricle has no regional wall motion abnormalities. There is mild left ventricular hypertrophy. Left ventricular diastolic parameters are indeterminate.  2. Right ventricular systolic function is normal. The right ventricular  size is normal.  3. Mild mitral valve regurgitation. Moderate mitral annular calcification.  4. The aortic valve is tricuspid. Aortic valve regurgitation is not visualized.  5. The inferior vena cava is dilated in size with <50% respiratory variability, suggesting right atrial pressure of 15 mmHg. FINDINGS  Left Ventricle: Left ventricular ejection fraction, by estimation, is 55 to 60%. The left ventricle has normal function. The left ventricle has no regional wall motion abnormalities. Definity contrast agent was given IV to delineate the left ventricular  endocardial borders. The left ventricular internal cavity size was normal in size. There is mild left ventricular hypertrophy. Left ventricular diastolic parameters are indeterminate. Right Ventricle: The right ventricular size is normal. Right vetricular wall thickness was not assessed. Right ventricular systolic function is normal. Left Atrium: Left atrial size was normal in size. Right Atrium: Right atrial size was normal in size. Pericardium: There is no evidence of pericardial effusion. Mitral Valve: There is mild thickening of the mitral valve leaflet(s). Moderate mitral annular calcification. Mild mitral valve regurgitation. MV peak gradient, 12.8 mmHg. The mean mitral valve gradient is 4.0 mmHg. Tricuspid Valve: The tricuspid valve is normal in structure. Tricuspid valve regurgitation is trivial. Aortic Valve: The aortic valve is tricuspid. Aortic valve regurgitation is not visualized. Aortic valve mean gradient measures 3.0 mmHg. Aortic valve peak gradient measures 6.6 mmHg. Aortic valve area, by VTI measures 2.62  cm. Pulmonic Valve: The pulmonic valve was grossly normal. Pulmonic valve regurgitation is not visualized. No evidence of pulmonic stenosis. Aorta: The aortic root is normal in size and structure. Venous: The inferior vena cava is dilated in size with less than 50% respiratory variability, suggesting right atrial pressure of 15 mmHg.  IAS/Shunts: No atrial level shunt detected by color flow Doppler.  LEFT VENTRICLE PLAX 2D LVIDd:         4.50 cm   Diastology LVIDs:         3.10 cm   LV e' medial:    4.13 cm/s LV PW:         1.30 cm   LV E/e' medial:  42.1 LV IVS:        1.50 cm   LV e' lateral:   8.27 cm/s LVOT diam:     2.10 cm   LV E/e' lateral: 21.0 LV SV:         59 LV SV Index:   30 LVOT Area:     3.46 cm  RIGHT VENTRICLE             IVC RV Basal diam:  3.30 cm     IVC diam: 2.60 cm RV S prime:     13.30 cm/s TAPSE (M-mode): 1.2 cm LEFT ATRIUM             Index        RIGHT ATRIUM           Index LA diam:        4.30 cm 2.22 cm/m   RA Area:     19.50 cm LA Vol (A2C):   75.7 ml 39.10 ml/m  RA Volume:   54.00 ml  27.89 ml/m LA Vol (A4C):   74.6 ml 38.54 ml/m LA Biplane Vol: 79.5 ml 41.07 ml/m  AORTIC VALVE AV Area (Vmax):    2.58 cm AV Area (Vmean):   2.60 cm AV Area (VTI):     2.62 cm AV Vmax:           128.00 cm/s AV Vmean:          82.600 cm/s AV VTI:            0.223 m AV Peak Grad:      6.6 mmHg AV Mean Grad:      3.0 mmHg LVOT Vmax:         95.50 cm/s LVOT Vmean:        61.900 cm/s LVOT VTI:          0.169 m LVOT/AV VTI ratio: 0.76  AORTA Ao Root diam: 3.20 cm MITRAL VALVE MV Area (PHT): 3.79 cm     SHUNTS MV Area VTI:   1.61 cm     Systemic VTI:  0.17 m MV Peak grad:  12.8 mmHg    Systemic Diam: 2.10 cm MV Mean grad:  4.0 mmHg MV Vmax:       1.79 m/s MV Vmean:      92.3 cm/s MV Decel Time: 200 msec MV E velocity: 174.00 cm/s MV A velocity: 53.10 cm/s MV E/A ratio:  3.28 Dorris Carnes MD Electronically signed by Dorris Carnes MD Signature Date/Time: 05/19/2022/12:40:27 PM    Final     Patient Profile     51 y.o. male with PMH of HTN and HLD who self-discontinued anti-hypertensive meds 2 years ago due to symptoms of fatigue and presyncope came in with dyspnea, epigastric pain and hypertensive emergency with trop  leak. BP on arrival 237/139.  Echocardiogram showed normal LV function, mild left ventricular  hypertrophy.  Assessment & Plan    1 hypertensive emergency-blood pressure improving but remains mildly elevated.  Would continue lisinopril and carvedilol at present dose.  Patient can be discharged from a cardiac standpoint.  Follow blood pressure as an outpatient and adjust regimen as needed.  Could add amlodipine if necessary.  Note LV function normal.  2 mildly elevated troponin-no clear trend and patient denies chest pain.  No plans for further ischemia evaluation.  3 hyperlipidemia-statin has been initiated.  Patient needs lipids and liver in 8 weeks.  4 elevated hemoglobin A1c-Per primary care.  5 acute kidney injury-creatinine remains minimally increased.  Patient did have a CTA at time of admission.  There could be contribution from contrast nephropathy.  Diuretic has been discontinued.  Would continue ACE inhibitor for now.  We will check potassium and renal function 1 week following discharge.  Patient can be discharged from a cardiac standpoint.  We will arrange follow-up with APP 2 weeks after discharge.  Check BMP at 1 week after discharge.  We will sign off.  Please call with questions.  For questions or updates, please contact [facility] Please consult [email or web address] for contact info under        Signed, Kirk Ruths, MD  05/21/2022, 6:12 AM

## 2022-05-21 NOTE — Discharge Summary (Signed)
Physician Discharge Summary   Patient: Austin Moreno MRN: [id number] DOB: 1970-12-19  Admit date:     05/18/2022  Discharge date: 05/21/22  Discharge Physician: Thad Ranger, MD    PCP: Julieanne Manson, MD   Recommendations at discharge:   Started on Coreg 12.5 g p.o. twice daily Started on lisinopril 20 mg daily, Norvasc 5 mg daily Started on Crestor 20 mg daily Hemoglobin A1c 6.4, recommended strict carb modified diet, diet and weight control Please check bmet in 1 week Follow lipid panel in 4 weeks  Discharge Diagnoses:    Hypertensive emergency   Hyperlipidemia   Prediabetes   Obesity   Chest pain   Elevated troponin   Acute diastolic CHF (congestive heart failure) (HCC)   Cirrhosis of liver (HCC)   Hypokalemia   Hospital Course:  Patient is a 51 year old male with HTN, prediabetes, obesity, HLP, presented to ED due to headache and shortness of breath.  Patient reported that on Monday, 2 days prior to admission, he did not feel well, had headache, developed blurred vision, nausea but no vomiting.  No other localized weakness.  Subsequently he developed mild chest pain, central without any radiation.  Per patient, he had self discontinued his antihypertensives 2 years ago due to symptoms of fatigue and dizziness. He presented to ER, was found to have uncontrolled hypertension with SBP 237/139, heart rate of 101.  Patient received IV labetalol and hydralazine with resolution of the chest pain.  Troponins was noted to be elevated with BNP 1192.  Cardiology was consulted.  Patient was admitted for further work-up.  Assessment and Plan:  Hypertensive emergency, chest pain -Patient presented with symptoms of headache, blurred vision, shortness of breath, with elevated BNP and troponins, SBP in 240s. -Patient received IV labetalol, hydralazine, IV Lasix in ED -2D echo showed EF of 55 to 60%, no regional wall motion abnormalities, diastolic parameters  indeterminate. -CTA showed no PE or aortic dissection, trace bilateral pleural effusion -Cardiology was consulted, placed on Coreg 12.5 mg twice daily, lisinopril 20 mg daily, BP readings still elevated, started on Norvasc 5 mg daily -Needs to bmet in 1 week for renal function.  Counseled on compliance.       Acute diastolic CHF (congestive heart failure) (HCC) -BNP 1192, has received IV Lasix.  Likely hypertension induced pulmonary edema -2D echo showed EF of 55 to 60% -Currently, euvolemic, lungs clear, no further need for Lasix   Elevated troponins -Likely due to demand ischemia from hypertensive emergency     Hyperlipidemia -Cholesterol 204, HDL 44, LDL 146 -Placed on Crestor 20 mg daily       Prediabetes -Hemoglobin A1c 6.4 -Recommended diet and weight control -Appointment scheduled for outpatient follow-up with IM clinic, follow a hemoglobin A1c in 4 to 6 weeks     Cirrhosis of liver (HCC) -CT chest abdomen pelvis showed nodular liver capsule and left lobe hypertrophy compatible with cirrhosis.  Prior history of alcohol use. -Avoid alcohol, outpatient follow-up and GI referral     Hypokalemia -Replaced   Proteinuria, AKI -Creatinine trending up, 1.4, likely due to diuresis, with Lasix.  Was placed on HCTZ and lisinopril.   -UA > 300 proteinuria, will benefit from ACE/ARB, started on lisinopril -Creatinine 1.5, no further need for diuresis, HCTZ also discontinued -Follow renal function closely outpatient in 1 week  Obesity Estimated body mass index is 30.85 kg/m as calculated from the following:   Height as of this encounter: 5\' 6"  (1.676 m).   Weight as of  this encounter: 86.7 kg.         Pain control - [employer] Controlled Substance Reporting System database was reviewed. and patient was instructed, not to drive, operate heavy machinery, perform activities at heights, swimming or participation in water activities or provide baby-sitting services while on  Pain, Sleep and Anxiety Medications; until their outpatient Physician has advised to do so again. Also recommended to not to take more than prescribed Pain, Sleep and Anxiety Medications.  Consultants: Cardiology Procedures performed: 2D echo Disposition: Home Diet recommendation:  Discharge Diet Orders (From admission, onward)     Start     Ordered   05/21/22 0000  Diet Carb Modified        05/21/22 0744           Carb modified diet DISCHARGE MEDICATION: Allergies as of 05/21/2022   No Known Allergies      Medication List     STOP taking these medications    mometasone 50 MCG/ACT nasal spray Commonly known as: Nasonex   oseltamivir 75 MG capsule Commonly known as: TAMIFLU       TAKE these medications    acetaminophen 500 MG tablet Commonly known as: TYLENOL Take 1,000 mg by mouth daily as needed for moderate pain.   amLODipine 5 MG tablet Commonly known as: NORVASC Take 1 tablet (5 mg total) by mouth daily. What changed:  medication strength how much to take   aspirin EC 81 MG tablet Take 1 tablet (81 mg total) by mouth daily. Swallow whole.   carvedilol 12.5 MG tablet Commonly known as: COREG Take 1 tablet (12.5 mg total) by mouth 2 (two) times daily with a meal.   lisinopril 20 MG tablet Commonly known as: ZESTRIL Take 1 tablet (20 mg total) by mouth daily. What changed:  medication strength how much to take   rosuvastatin 20 MG tablet Commonly known as: CRESTOR Take 1 tablet (20 mg total) by mouth at bedtime.        Follow-up [facility] Follow up on 06/06/2022.   Why: at 3:15PM, for hospital follow-up, obtain labs BMET Contact information: [address] [place] [phone]        Lelon Perla, MD. Schedule an appointment as soon as possible for a visit in 2 week(s).   Specialty: Cardiology Why: for hospital follow-up Contact information: [address] [place] [address] [phone]                Discharge Exam: Danley Danker Weights   05/19/22 2326  Weight: 86.7 kg   S: No acute complaints, wife at the bedside, wants to go home.  BP readings still elevated  Vitals:   05/20/22 0730 05/20/22 1442 05/20/22 2029 05/21/22 0527  BP: (!) 155/109 (!) 141/94 (!) 147/101 (!) 179/109  Pulse:  77 78 80  Resp:  15 16 17   Temp:  98.3 F (36.8 C) 99 F (37.2 C) 98 F (36.7 C)  TempSrc:  Oral Oral Oral  SpO2:  100% 95% 95%  Weight:      Height:        Physical Exam General: Alert and oriented x 3, NAD Cardiovascular: S1 S2 clear, RRR.  Respiratory: CTAB, no wheezing, rales or rhonchi Gastrointestinal: Soft, nontender, nondistended, NBS Ext: no pedal edema bilaterally Neuro: no new deficits Skin: No rashes Psych: Normal affect and demeanor, alert and oriented x3    Condition at discharge:  fair  The results of significant diagnostics from this hospitalization (including imaging, microbiology, ancillary and laboratory) are listed below for reference.   Imaging Studies: ECHOCARDIOGRAM COMPLETE  Result Date: 05/19/2022    ECHOCARDIOGRAM REPORT   Patient Name:   Austin Moreno Date of Exam: 05/19/2022 Medical Rec #:  [id number]           Height:       62.0 in Accession #:    [id number]          Weight:       206.0 lb Date of Birth:  1971-03-14           BSA:          1.936 m Patient Age:    51 years            BP:           142/89 mmHg Patient Gender: M                   HR:           79 bpm. Exam Location:  Inpatient Procedure: 2D Echo, Cardiac Doppler, Color Doppler and Intracardiac            Opacification Agent Indications:    Chest pain  History:        Patient has no prior history of Echocardiogram examinations.                 Risk Factors:Hypertension and Dyslipidemia.  Sonographer:    Clayton Lefort RDCS (AE) Referring Phys: [id number] BELAL A SULEIMAN  Sonographer Comments: Suboptimal apical window. IMPRESSIONS  1.  Left ventricular ejection fraction, by estimation, is 55 to 60%. The left ventricle has normal function. The left ventricle has no regional wall motion abnormalities. There is mild left ventricular hypertrophy. Left ventricular diastolic parameters are indeterminate.  2. Right ventricular systolic function is normal. The right ventricular size is normal.  3. Mild mitral valve regurgitation. Moderate mitral annular calcification.  4. The aortic valve is tricuspid. Aortic valve regurgitation is not visualized.  5. The inferior vena cava is dilated in size with <50% respiratory variability, suggesting right atrial pressure of 15 mmHg. FINDINGS  Left Ventricle: Left ventricular ejection fraction, by estimation, is 55 to 60%. The left ventricle has normal function. The left ventricle has no regional wall motion abnormalities. Definity contrast agent was given IV to delineate the left ventricular  endocardial borders. The left ventricular internal cavity size was normal in size. There is mild left ventricular hypertrophy. Left ventricular diastolic parameters are indeterminate. Right Ventricle: The right ventricular size is normal. Right vetricular wall thickness was not assessed. Right ventricular systolic function is normal. Left Atrium: Left atrial size was normal in size. Right Atrium: Right atrial size was normal in size. Pericardium: There is no evidence of pericardial effusion. Mitral Valve: There is mild thickening of the mitral valve leaflet(s). Moderate mitral annular calcification. Mild mitral valve regurgitation. MV peak gradient, 12.8 mmHg. The mean mitral valve gradient is 4.0 mmHg. Tricuspid Valve: The tricuspid valve is normal in structure. Tricuspid valve regurgitation is trivial. Aortic Valve: The aortic valve is tricuspid. Aortic valve regurgitation is not visualized. Aortic valve mean gradient measures 3.0 mmHg. Aortic valve peak gradient measures 6.6 mmHg. Aortic valve area, by VTI measures 2.62 cm.  Pulmonic Valve: The pulmonic valve was grossly normal. Pulmonic valve regurgitation is not visualized. No evidence of pulmonic stenosis. Aorta: The aortic root is normal in size and structure.  Venous: The inferior vena cava is dilated in size with less than 50% respiratory variability, suggesting right atrial pressure of 15 mmHg. IAS/Shunts: No atrial level shunt detected by color flow Doppler.  LEFT VENTRICLE PLAX 2D LVIDd:         4.50 cm   Diastology LVIDs:         3.10 cm   LV e' medial:    4.13 cm/s LV PW:         1.30 cm   LV E/e' medial:  42.1 LV IVS:        1.50 cm   LV e' lateral:   8.27 cm/s LVOT diam:     2.10 cm   LV E/e' lateral: 21.0 LV SV:         59 LV SV Index:   30 LVOT Area:     3.46 cm  RIGHT VENTRICLE             IVC RV Basal diam:  3.30 cm     IVC diam: 2.60 cm RV S prime:     13.30 cm/s TAPSE (M-mode): 1.2 cm LEFT ATRIUM             Index        RIGHT ATRIUM           Index LA diam:        4.30 cm 2.22 cm/m   RA Area:     19.50 cm LA Vol (A2C):   75.7 ml 39.10 ml/m  RA Volume:   54.00 ml  27.89 ml/m LA Vol (A4C):   74.6 ml 38.54 ml/m LA Biplane Vol: 79.5 ml 41.07 ml/m  AORTIC VALVE AV Area (Vmax):    2.58 cm AV Area (Vmean):   2.60 cm AV Area (VTI):     2.62 cm AV Vmax:           128.00 cm/s AV Vmean:          82.600 cm/s AV VTI:            0.223 m AV Peak Grad:      6.6 mmHg AV Mean Grad:      3.0 mmHg LVOT Vmax:         95.50 cm/s LVOT Vmean:        61.900 cm/s LVOT VTI:          0.169 m LVOT/AV VTI ratio: 0.76  AORTA Ao Root diam: 3.20 cm MITRAL VALVE MV Area (PHT): 3.79 cm     SHUNTS MV Area VTI:   1.61 cm     Systemic VTI:  0.17 m MV Peak grad:  12.8 mmHg    Systemic Diam: 2.10 cm MV Mean grad:  4.0 mmHg MV Vmax:       1.79 m/s MV Vmean:      92.3 cm/s MV Decel Time: 200 msec MV E velocity: 174.00 cm/s MV A velocity: 53.10 cm/s MV E/A ratio:  3.28 Dorris Carnes MD Electronically signed by Dorris Carnes MD Signature Date/Time: 05/19/2022/12:40:27 PM    Final    CT Angio  Chest/Abd/Pel for Dissection W and/or W/WO  Result Date: 05/18/2022 CLINICAL DATA:  Head and stomach pain since yesterday, hypertension EXAM: CT ANGIOGRAPHY CHEST, ABDOMEN AND PELVIS TECHNIQUE: Non-contrast CT of the chest was initially obtained. Multidetector CT imaging through the chest, abdomen and pelvis was performed using the standard protocol during bolus administration of intravenous contrast. Multiplanar reconstructed images and MIPs were obtained and reviewed to evaluate the  vascular anatomy. RADIATION DOSE REDUCTION: This exam was performed according to the departmental dose-optimization program which includes automated exposure control, adjustment of the mA and/or kV according to patient size and/or use of iterative reconstruction technique. CONTRAST:  14mL OMNIPAQUE IOHEXOL 350 MG/ML SOLN COMPARISON:  08/29/2018 FINDINGS: CTA CHEST FINDINGS Cardiovascular: No evidence of thoracic aortic aneurysm or dissection. Mild cardiomegaly without pericardial effusion. Atherosclerosis of the coronary vasculature. Mediastinum/Nodes: No enlarged mediastinal, hilar, or axillary lymph nodes. Thyroid gland, trachea, and esophagus demonstrate no significant findings. Lungs/Pleura: Trace bilateral pleural effusions, right greater than left. No airspace disease or pneumothorax. Central airways are patent. Musculoskeletal: There are no acute or destructive bony lesions. Reconstructed images demonstrate no additional findings. Review of the MIP images confirms the above findings. CTA ABDOMEN AND PELVIS FINDINGS VASCULAR Aorta: Normal caliber aorta without aneurysm, dissection, vasculitis or significant stenosis. Atherosclerosis of the distal aorta. Celiac: Patent without evidence of aneurysm, dissection, vasculitis or significant stenosis. SMA: Patent without evidence of aneurysm, dissection, vasculitis or significant stenosis. Renals: Both renal arteries are patent without evidence of aneurysm, dissection, vasculitis,  fibromuscular dysplasia or significant stenosis. IMA: Patent without evidence of aneurysm, dissection, vasculitis or significant stenosis. Inflow: Patent without evidence of aneurysm, dissection, vasculitis or significant stenosis. Veins: No obvious venous abnormality within the limitations of this arterial phase study. Review of the MIP images confirms the above findings. NON-VASCULAR Hepatobiliary: Nodularity of the liver capsule and hypertrophy of the left lobe liver consistent with cirrhosis. No focal parenchymal liver abnormality. Gallbladder is unremarkable. Pancreas: Unremarkable. No pancreatic ductal dilatation or surrounding inflammatory changes. Spleen: Normal in size without focal abnormality. Adrenals/Urinary Tract: Adrenal glands are unremarkable. Kidneys are normal, without renal calculi, focal lesion, or hydronephrosis. Bladder is unremarkable. Stomach/Bowel: No bowel obstruction or ileus. Normal appendix right lower quadrant. No bowel wall thickening or inflammatory change. Lymphatic: No pathologic adenopathy. Reproductive: Prostate is unremarkable. Other: No free fluid or free intraperitoneal gas. No abdominal wall hernia. Musculoskeletal: No acute or destructive bony lesions. Reconstructed images demonstrate no additional findings. Review of the MIP images confirms the above findings. IMPRESSION: 1. No evidence of thoracoabdominal aortic aneurysm or dissection. 2. Trace bilateral pleural effusions, right greater than left. 3. Nodular liver capsule and left lobe hypertrophy compatible with cirrhosis. 4.  Aortic Atherosclerosis (ICD10-I70.0). Electronically Signed   By: Randa Ngo M.D.   On: 05/18/2022 20:39   CT Head Wo Contrast  Result Date: 05/18/2022 CLINICAL DATA:  Sudden severe headaches EXAM: CT HEAD WITHOUT CONTRAST TECHNIQUE: Contiguous axial images were obtained from the base of the skull through the vertex without intravenous contrast. RADIATION DOSE REDUCTION: This exam was  performed according to the departmental dose-optimization program which includes automated exposure control, adjustment of the mA and/or kV according to patient size and/or use of iterative reconstruction technique. COMPARISON:  10/01/2013 FINDINGS: Brain: No acute intracranial findings are seen. There are no signs of bleeding within the cranium. Ventricles are not dilated. There are multiple calcifications in both cerebral hemispheres with no significant interval change. There is no adjacent edema or mass effect. This finding may suggest presence of multiple healed granulomas or residual changes from some other inflammatory process such as cysticercosis. Vascular: Unremarkable. Skull: There is decreased in number of air cells in mastoids. No fracture is seen. Sinuses/Orbits: No acute findings are seen. Other: No significant interval changes are noted. IMPRESSION: No acute intracranial findings are seen in noncontrast CT brain. There are few scattered calcifications of varying sizes in both cerebral hemispheres with no  interval change since 10/01/2013 suggesting residual change from previous inflammation. Electronically Signed   By: Elmer Picker M.D.   On: 05/18/2022 12:23    Microbiology: Results for orders placed or performed during the hospital encounter of 05/18/22  Resp Panel by RT-PCR (Flu A&B, Covid)     Status: None   Collection Time: 05/18/22  4:00 PM   Specimen: Nasal Swab  Result Value Ref Range Status   SARS Coronavirus 2 by RT PCR NEGATIVE NEGATIVE Final    Comment: (NOTE) SARS-CoV-2 target nucleic acids are NOT DETECTED.  The SARS-CoV-2 RNA is generally detectable in upper respiratory specimens during the acute phase of infection. The lowest concentration of SARS-CoV-2 viral copies this assay can detect is 138 copies/mL. A negative result does not preclude SARS-Cov-2 infection and should not be used as the sole basis for treatment or other patient management decisions. A  negative result may occur with  improper specimen collection/handling, submission of specimen other than nasopharyngeal swab, presence of viral mutation(s) within the areas targeted by this assay, and inadequate number of viral copies(<138 copies/mL). A negative result must be combined with clinical observations, patient history, and epidemiological information. The expected result is Negative.  Fact Sheet for Patients:  EntrepreneurPulse.com.au  Fact Sheet for Healthcare Providers:  IncredibleEmployment.be  This test is no t yet approved or cleared by the [place] FDA and  has been authorized for detection and/or diagnosis of SARS-CoV-2 by FDA under an Emergency Use Authorization (EUA). This EUA will remain  in effect (meaning this test can be used) for the duration of the COVID-19 declaration under Section 564(b)(1) of the Act, 21 U.S.C.section 360bbb-3(b)(1), unless the authorization is terminated  or revoked sooner.       Influenza A by PCR NEGATIVE NEGATIVE Final   Influenza B by PCR NEGATIVE NEGATIVE Final    Comment: (NOTE) The Xpert Xpress SARS-CoV-2/FLU/RSV plus assay is intended as an aid in the diagnosis of influenza from Nasopharyngeal swab specimens and should not be used as a sole basis for treatment. Nasal washings and aspirates are unacceptable for Xpert Xpress SARS-CoV-2/FLU/RSV testing.  Fact Sheet for Patients: EntrepreneurPulse.com.au  Fact Sheet for Healthcare Providers: IncredibleEmployment.be  This test is not yet approved or cleared by the [place] FDA and has been authorized for detection and/or diagnosis of SARS-CoV-2 by FDA under an Emergency Use Authorization (EUA). This EUA will remain in effect (meaning this test can be used) for the duration of the COVID-19 declaration under Section 564(b)(1) of the Act, 21 U.S.C. section 360bbb-3(b)(1), unless the authorization  is terminated or revoked.  Performed at [facility], [place] [address], [place] [address]     Labs: CBC: Recent Labs  Lab 05/18/22 1144 05/18/22 2315 05/19/22 0356  WBC 9.8 10.7* 9.7  NEUTROABS 7.1  --   --   HGB 13.1 12.7* 12.6*  HCT 37.8* 36.9* 36.8*  MCV 86.7 86.4 87.0  PLT 264 293 0000000   Basic Metabolic Panel: Recent Labs  Lab 05/18/22 1144 05/18/22 2315 05/19/22 0356 05/20/22 0312 05/21/22 0142  NA 136  --  135 136 136  K 3.0*  --  3.2* 3.9 4.6  CL 99  --  98 103 102  CO2 26  --  27 25 26   GLUCOSE 122*  --  132* 124* 122*  BUN 8  --  11 20 27*  CREATININE 1.22 1.24 1.33* 1.41* 1.54*  CALCIUM 8.4*  --  8.8* 8.9 9.0  MG  --   --  2.2  --   --   PHOS  --   --  3.0  --   --    Liver Function Tests: Recent Labs  Lab 05/18/22 1144  AST 39  ALT 46*  ALKPHOS 67  BILITOT 0.9  PROT 7.4  ALBUMIN 3.3*   CBG: Recent Labs  Lab 05/20/22 0730 05/20/22 1203 05/20/22 1601 05/20/22 2249 05/21/22 0812  GLUCAP 140* 142* 144* 144* 118*    Discharge time spent: greater than 30 minutes.  Signed: Estill Cotta, MD [facility] 05/21/2022

## 2022-05-30 ENCOUNTER — Emergency Department (HOSPITAL_COMMUNITY): Payer: 59

## 2022-05-30 ENCOUNTER — Observation Stay (HOSPITAL_COMMUNITY)
Admission: EM | Admit: 2022-05-30 | Discharge: 2022-06-01 | Disposition: A | Discharge status: Home / Self Care | Payer: 59 | Attending: Family Medicine | Admitting: Family Medicine

## 2022-05-30 ENCOUNTER — Observation Stay (HOSPITAL_COMMUNITY): Payer: 59

## 2022-05-30 DIAGNOSIS — R41841 Cognitive communication deficit: Secondary | ICD-10-CM | POA: Insufficient documentation

## 2022-05-30 DIAGNOSIS — Z79899 Other long term (current) drug therapy: Secondary | ICD-10-CM | POA: Insufficient documentation

## 2022-05-30 DIAGNOSIS — I6522 Occlusion and stenosis of left carotid artery: Secondary | ICD-10-CM | POA: Diagnosis not present

## 2022-05-30 DIAGNOSIS — E669 Obesity, unspecified: Secondary | ICD-10-CM | POA: Diagnosis not present

## 2022-05-30 DIAGNOSIS — E785 Hyperlipidemia, unspecified: Secondary | ICD-10-CM | POA: Diagnosis present

## 2022-05-30 DIAGNOSIS — Z7982 Long term (current) use of aspirin: Secondary | ICD-10-CM | POA: Insufficient documentation

## 2022-05-30 DIAGNOSIS — R7303 Prediabetes: Secondary | ICD-10-CM | POA: Diagnosis not present

## 2022-05-30 DIAGNOSIS — F17210 Nicotine dependence, cigarettes, uncomplicated: Secondary | ICD-10-CM | POA: Diagnosis not present

## 2022-05-30 DIAGNOSIS — I700 Atherosclerosis of aorta: Secondary | ICD-10-CM | POA: Insufficient documentation

## 2022-05-30 DIAGNOSIS — Z6830 Body mass index (BMI) 30.0-30.9, adult: Secondary | ICD-10-CM | POA: Diagnosis not present

## 2022-05-30 DIAGNOSIS — I63412 Cerebral infarction due to embolism of left middle cerebral artery: Secondary | ICD-10-CM | POA: Diagnosis not present

## 2022-05-30 DIAGNOSIS — I5031 Acute diastolic (congestive) heart failure: Secondary | ICD-10-CM | POA: Diagnosis not present

## 2022-05-30 DIAGNOSIS — I10 Essential (primary) hypertension: Secondary | ICD-10-CM | POA: Diagnosis present

## 2022-05-30 DIAGNOSIS — K7460 Unspecified cirrhosis of liver: Secondary | ICD-10-CM | POA: Diagnosis present

## 2022-05-30 DIAGNOSIS — I110 Hypertensive heart disease with heart failure: Secondary | ICD-10-CM | POA: Diagnosis not present

## 2022-05-30 DIAGNOSIS — I083 Combined rheumatic disorders of mitral, aortic and tricuspid valves: Secondary | ICD-10-CM | POA: Insufficient documentation

## 2022-05-30 DIAGNOSIS — I639 Cerebral infarction, unspecified: Secondary | ICD-10-CM | POA: Diagnosis not present

## 2022-05-30 DIAGNOSIS — R29810 Facial weakness: Secondary | ICD-10-CM | POA: Diagnosis present

## 2022-05-30 LAB — I-STAT CHEM 8, ED
BUN: 30 mg/dL — ABNORMAL HIGH (ref 6–20)
Calcium, Ion: 1.19 mmol/L (ref 1.15–1.40)
Chloride: 105 mmol/L (ref 98–111)
Creatinine, Ser: 1.4 mg/dL — ABNORMAL HIGH (ref 0.61–1.24)
Glucose, Bld: 188 mg/dL — ABNORMAL HIGH (ref 70–99)
HCT: 43 % (ref 39.0–52.0)
Hemoglobin: 14.6 g/dL (ref 13.0–17.0)
Potassium: 4.3 mmol/L (ref 3.5–5.1)
Sodium: 138 mmol/L (ref 135–145)
TCO2: 23 mmol/L (ref 22–32)

## 2022-05-30 LAB — CBC
HCT: 41.6 % (ref 39.0–52.0)
Hemoglobin: 13.8 g/dL (ref 13.0–17.0)
MCH: 29.2 pg (ref 26.0–34.0)
MCHC: 33.2 g/dL (ref 30.0–36.0)
MCV: 87.9 fL (ref 80.0–100.0)
Platelets: 308 10*3/uL (ref 150–400)
RBC: 4.73 MIL/uL (ref 4.22–5.81)
RDW: 13.2 % (ref 11.5–15.5)
WBC: 11 10*3/uL — ABNORMAL HIGH (ref 4.0–10.5)
nRBC: 0 % (ref 0.0–0.2)

## 2022-05-30 LAB — COMPREHENSIVE METABOLIC PANEL
ALT: 40 U/L (ref 0–44)
AST: 28 U/L (ref 15–41)
Albumin: 3.1 g/dL — ABNORMAL LOW (ref 3.5–5.0)
Alkaline Phosphatase: 48 U/L (ref 38–126)
Anion gap: 8 (ref 5–15)
BUN: 28 mg/dL — ABNORMAL HIGH (ref 6–20)
CO2: 22 mmol/L (ref 22–32)
Calcium: 9.4 mg/dL (ref 8.9–10.3)
Chloride: 107 mmol/L (ref 98–111)
Creatinine, Ser: 1.32 mg/dL — ABNORMAL HIGH (ref 0.61–1.24)
GFR, Estimated: >60 mL/min (ref >60)
Glucose, Bld: 186 mg/dL — ABNORMAL HIGH (ref 70–99)
Potassium: 4.3 mmol/L (ref 3.5–5.1)
Sodium: 137 mmol/L (ref 135–145)
Total Bilirubin: 0.4 mg/dL (ref 0.3–1.2)
Total Protein: 7.7 g/dL (ref 6.5–8.1)

## 2022-05-30 LAB — RAPID URINE DRUG SCREEN, HOSP PERFORMED
Amphetamines: NOT DETECTED
Barbiturates: NOT DETECTED
Benzodiazepines: NOT DETECTED
Cocaine: NOT DETECTED
Opiates: NOT DETECTED
Tetrahydrocannabinol: NOT DETECTED

## 2022-05-30 LAB — URINALYSIS, ROUTINE W REFLEX MICROSCOPIC
Bilirubin Urine: NEGATIVE
Glucose, UA: NEGATIVE mg/dL
Hgb urine dipstick: NEGATIVE
Ketones, ur: NEGATIVE mg/dL
Leukocytes,Ua: NEGATIVE
Nitrite: NEGATIVE
Protein, ur: NEGATIVE mg/dL
Specific Gravity, Urine: 1.02 (ref 1.005–1.030)
pH: 6 (ref 5.0–8.0)

## 2022-05-30 LAB — DIFFERENTIAL
Abs Immature Granulocytes: 0.03 10*3/uL (ref 0.00–0.07)
Basophils Absolute: 0.2 10*3/uL — ABNORMAL HIGH (ref 0.0–0.1)
Basophils Relative: 1 %
Eosinophils Absolute: 0.2 10*3/uL (ref 0.0–0.5)
Eosinophils Relative: 2 %
Immature Granulocytes: 0 %
Lymphocytes Relative: 32 %
Lymphs Abs: 3.5 10*3/uL (ref 0.7–4.0)
Monocytes Absolute: 1 10*3/uL (ref 0.1–1.0)
Monocytes Relative: 9 %
Neutro Abs: 6.1 10*3/uL (ref 1.7–7.7)
Neutrophils Relative %: 56 %

## 2022-05-30 LAB — CBG MONITORING, ED: Glucose-Capillary: 200 mg/dL — ABNORMAL HIGH (ref 70–99)

## 2022-05-30 LAB — ETHANOL: Alcohol, Ethyl (B): <10 mg/dL (ref <10)

## 2022-05-30 MED ORDER — LISINOPRIL 20 MG PO TABS
20.0000 mg | ORAL_TABLET | Freq: Every day | ORAL | Status: DC
  Administered 2022-05-31 – 2022-06-01 (×2): 20 mg via ORAL
  Filled 2022-05-30 (×2): qty 1

## 2022-05-30 MED ORDER — CLOPIDOGREL BISULFATE 75 MG PO TABS
75.0000 mg | ORAL_TABLET | Freq: Every day | ORAL | Status: DC
  Administered 2022-05-30 – 2022-06-01 (×3): 75 mg via ORAL
  Filled 2022-05-30 (×3): qty 1

## 2022-05-30 MED ORDER — ASPIRIN 81 MG PO TBEC
81.0000 mg | DELAYED_RELEASE_TABLET | Freq: Every day | ORAL | Status: DC
  Administered 2022-05-31 – 2022-06-01 (×2): 81 mg via ORAL
  Filled 2022-05-30 (×2): qty 1

## 2022-05-30 MED ORDER — ACETAMINOPHEN 650 MG RE SUPP: 650.0000 mg | RECTAL | Status: DC | PRN

## 2022-05-30 MED ORDER — ACETAMINOPHEN 325 MG PO TABS: 650.0000 mg | ORAL_TABLET | ORAL | Status: DC | PRN

## 2022-05-30 MED ORDER — SENNOSIDES-DOCUSATE SODIUM 8.6-50 MG PO TABS: 1.0000 | ORAL_TABLET | Freq: Every evening | ORAL | Status: DC | PRN

## 2022-05-30 MED ORDER — STROKE: EARLY STAGES OF RECOVERY BOOK
Freq: Once | Status: AC
  Filled 2022-05-30: qty 1

## 2022-05-30 MED ORDER — ROSUVASTATIN CALCIUM 20 MG PO TABS
20.0000 mg | ORAL_TABLET | Freq: Every day | ORAL | Status: DC
  Administered 2022-05-30 – 2022-05-31 (×2): 20 mg via ORAL
  Filled 2022-05-30 (×2): qty 1

## 2022-05-30 MED ORDER — AMLODIPINE BESYLATE 5 MG PO TABS
5.0000 mg | ORAL_TABLET | Freq: Every day | ORAL | Status: DC
  Administered 2022-05-31 – 2022-06-01 (×2): 5 mg via ORAL
  Filled 2022-05-30 (×2): qty 1

## 2022-05-30 MED ORDER — SODIUM CHLORIDE 0.9 % IV SOLN: INTRAVENOUS | Status: AC

## 2022-05-30 MED ORDER — ACETAMINOPHEN 160 MG/5ML PO SOLN: 650.0000 mg | ORAL | Status: DC | PRN

## 2022-05-30 MED ORDER — ENOXAPARIN SODIUM 40 MG/0.4ML IJ SOSY
40.0000 mg | PREFILLED_SYRINGE | Freq: Every day | INTRAMUSCULAR | Status: DC
  Administered 2022-05-30 – 2022-05-31 (×2): 40 mg via SUBCUTANEOUS
  Filled 2022-05-30 (×2): qty 0.4

## 2022-05-30 MED ORDER — CARVEDILOL 12.5 MG PO TABS
12.5000 mg | ORAL_TABLET | Freq: Two times a day (BID) | ORAL | Status: DC
  Administered 2022-05-31 – 2022-06-01 (×3): 12.5 mg via ORAL
  Filled 2022-05-30 (×3): qty 1

## 2022-05-30 NOTE — Assessment & Plan Note (Signed)
Continue rosuvastatin.  

## 2022-05-30 NOTE — Assessment & Plan Note (Signed)
Cirrhotic morphology noted on CT 05/18/2022.  No sign of hepatic encephalopathy or acute decompensation.

## 2022-05-30 NOTE — ED Triage Notes (Signed)
Pt to ED c/o facial droop and difficulty with speech since Friday night.  Wallee spanish interpretor used to assist with assessment. PA at bedside during this assessment.

## 2022-05-30 NOTE — ED Provider Triage Note (Signed)
Emergency Medicine Provider Triage Evaluation Note  Austin Moreno , a 51 y.o. male  was evaluated in triage.  Pt complains of facial droop, difficulty making words and confusion.  History is limited by language barrier.  Patient's wife reports that he was last known well Friday night before he went to bed.  He woke the next day with difficulty moving his face, difficulty understanding speech versus confusion.  Review of Systems  Positive: Facial asymmetry Negative: Weakness  Physical Exam  BP (!) 144/93   Pulse 66   Temp 98.4 F (36.9 C) (Oral)   Resp (!) 24   Ht 5\' 6"  (1.676 m)   Wt 86.6 kg   SpO2 99%   BMI 30.83 kg/m  Gen:   Awake, no distress   Resp:  Normal effort  MSK:   Moves extremities without difficulty  Other:  No asymmetry, unable to follow commands.  Medical Decision Making  Medically screening exam initiated at 3:45 PM.  Appropriate orders placed.  Tannen Razo-Amezquita was informed that the remainder of the evaluation will be completed by another provider, this initial triage assessment does not replace that evaluation, and the importance of remaining in the ED until their evaluation is complete.  Patient with concerning symptoms of stroke receptive aphasia. He is out of the window for any acute code stroke call.  Workup initiated   , PA-C 05/30/22 1547

## 2022-05-30 NOTE — Assessment & Plan Note (Signed)
Presenting with right facial droop, dysarthria, expressive aphasia.  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 -Neurology to follow -CTA head and neck -Recent TTE 05/19/2022 showed EF 55-60% -Continue aspirin 81 mg daily -Start Plavix 75 mg daily -Keep on telemetry, continue neurochecks -Hemoglobin A1c 6.4% on 05/19/2022 -Lipid panel -PT/OT/SLP eval -Out of permissive hypertension window, resume home meds

## 2022-05-30 NOTE — Assessment & Plan Note (Signed)
BP mildly elevated but much better than prior admission.  Resume home meds. -Amlodipine 5 mg daily -Coreg 12.5 mg twice daily -Lisinopril 20 mg daily

## 2022-05-30 NOTE — H&P (Signed)
History and Physical    Austin Moreno AOZ:[id number] DOB: July 25, 1970 DOA: 05/30/2022  PCP: Julieanne Manson, MD  Patient coming from: Home  I have personally briefly reviewed patient's old medical records in [facility]  Chief Complaint: Right facial droop, speech difficulty  HPI: Austin Moreno is a 51 y.o. Spanish-speaking male with medical history significant for HTN, HLD, prediabetes, obesity, hepatic cirrhosis findings on CT who presented to the ED for evaluation of right facial droop and speech difficulty.  Patient recently admitted 05/18/2022-05/21/2022 for hypertensive emergency associated with elevated troponin and pulmonary edema. TTE 05/19/2022 showed EF 55-60%, LV diastolic parameters indeterminate.  Initially treated with IV labetalol, hydralazine, IV Lasix.  Discharged on Coreg 12.5 mg BID, lisinopril 20 mg daily, amlodipine 5 mg daily.  Also started on aspirin 81 mg.  Patient reports adherence to medications since recent discharge from the hospital.  Austin Moreno.  When Austin awoke on the morning of 11/25 Austin noticed new right facial droop with dysarthric speech and expressive aphasia.  Austin had difficulty walking and reported feeling pain at the top of his left foot but no numbness/tingling.  Austin denies weakness in his extremities.  Austin says Austin previously smoked 1-2 packs/week and drank 1-2 beers daily prior to his recent hospitalization but has not use any tobacco or alcohol since Austin was discharged.  ED Course  Labs/Imaging on admission: I have personally reviewed following labs and imaging studies.  Initial vitals showed BP 144/93, pulse 66, RR 24, temp 98.4 F, SpO2 99% on room air.  Labs show WBC 11.0, hemoglobin 13.8, platelets 308,000, sodium 137, potassium 4.3, bicarb 22, BUN 28, creatinine 1.32, serum glucose 186, LFTs within normal limits, serum ethanol <10.  MRI brain without contrast shows acute infarct involving  the posterior limb of the internal capsule on the left.  No hemorrhage.  Redemonstration of scattered calcific foci in the bilateral cerebral hemispheres noted, favored to represent sequela of a prior/chronic infectious or inflammatory process.  Neurology consulted and recommended medical admission for further CVA workup.  The hospitalist service was consulted to admit for further evaluation and management.  Review of Systems: All systems reviewed and are negative except as documented in history of present illness above.   Past Medical History:  Diagnosis Date   Allergy    Has a nurse friend who gives him a shot every year for his allergies--sounds like corticosteroids   Hyperlipidemia    With good HDL   Hypertension    Obesity (BMI 30-39.9) 2016   Prediabetes     No past surgical history on file.  Social History:  reports that Austin has been smoking cigarettes. Austin has never used smokeless tobacco. Austin reports current alcohol use. Austin reports that Austin does not use drugs.  No Known Allergies  Family History  Problem Relation Age of Onset   Heart disease Mother        CAD   Heart disease Brother 46       one brother with heart valve problems     Prior to Admission medications   Medication Sig Start Date End Date Taking? Authorizing Provider  acetaminophen (TYLENOL) 500 MG tablet Take 1,000 mg by mouth daily as needed for moderate pain.    [provider]  amLODipine (NORVASC) 5 MG tablet Take 1 tablet (5 mg total) by mouth daily. 05/21/22   Rai, Delene Ruffini, MD  aspirin EC 81 MG tablet Take 1  tablet (81 mg total) by mouth daily. Swallow whole. 05/21/22   Rai, Vernelle Emerald, MD  carvedilol (COREG) 12.5 MG tablet Take 1 tablet (12.5 mg total) by mouth 2 (two) times daily with a meal. 05/21/22   Rai, Ripudeep K, MD  lisinopril (ZESTRIL) 20 MG tablet Take 1 tablet (20 mg total) by mouth daily. 05/21/22   Rai, Vernelle Emerald, MD  rosuvastatin (CRESTOR) 20 MG tablet Take 1 tablet (20 mg  total) by mouth at bedtime. 05/21/22   Mendel Corning, MD    Physical Exam: Vitals:   05/30/22 2042 05/30/22 2130 05/30/22 2200 05/30/22 2215  BP:  (!) 136/101 (!) 154/93 (!) 141/128  Pulse:  71 66 71  Resp:  18 15 (!) 25  Temp: 98.1 F (36.7 C)     TempSrc: Oral     SpO2:  100% 100% 100%  Weight:      Height:       Constitutional: Resting in bed, NAD, calm, comfortable Eyes: PERRL, EOMI, lids and conjunctivae normal ENMT: Mucous membranes are moist. Posterior pharynx clear of any exudate or lesions.Normal dentition.  Neck: normal, supple, no masses. Respiratory: clear to auscultation bilaterally, no wheezing, no crackles. Normal respiratory effort. No accessory muscle use.  Cardiovascular: Regular rate and rhythm, no murmurs / rubs / gallops. No extremity edema. 2+ pedal pulses. Abdomen: no tenderness, no masses palpated. No hepatosplenomegaly. Bowel sounds positive.  Musculoskeletal: no clubbing / cyanosis. No joint deformity upper and lower extremities. Good ROM, no contractures. Normal muscle tone.  Skin: no rashes, lesions, ulcers. No induration Neurologic: Right facial droop, dysarthric speech.  Appears to have some expressive aphasia.  CN 2-12 otherwise grossly intact. Sensation intact. Strength 5/5 in all 4.  Psychiatric: Alert and oriented x 3. Normal mood.   EKG: Ordered and pending.  Assessment/Plan Principal Problem:   Acute CVA (cerebrovascular accident) [facility]) Active Problems:   Hypertension   Hyperlipidemia   Cirrhosis of liver ([place])   Jaamal Razo-Amezquita is a 51 y.o. 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  Assessment and Plan: * Acute CVA (cerebrovascular accident) ([place]) Presenting with right facial droop, dysarthria, expressive aphasia.  MRI brain shows acute infarct involving the posterior limb of the  internal capsule on the left. -Neurology to follow -CTA head and neck -Recent TTE 05/19/2022 showed EF 55-60% -Continue aspirin 81 mg daily -Start Plavix 75 mg daily -Keep on telemetry, continue neurochecks -Hemoglobin A1c 6.4% on 05/19/2022 -Lipid panel -PT/OT/SLP eval -Out of permissive hypertension window, resume home meds  Hypertension BP mildly elevated but much better than prior admission.  Resume home meds. -Amlodipine 5 mg daily -Coreg 12.5 mg twice daily -Lisinopril 20 mg daily  Cirrhosis of liver ([facility]) Cirrhotic morphology noted on CT 05/18/2022.  No sign of hepatic encephalopathy or acute decompensation.  Hyperlipidemia Continue rosuvastatin.  DVT prophylaxis: enoxaparin (LOVENOX) injection 40 mg Start: 05/30/22 2245 Code Status: Full code Family Communication: Son and spouse at bedside Disposition Plan: From home and likely discharge to home pending clinical progress Consults called: Neurology Severity of Illness: The appropriate patient status for this patient is OBSERVATION. Observation status is judged to be reasonable and necessary in order to provide the required intensity of service to ensure the patient's safety. The patient's presenting symptoms, physical exam findings, and initial radiographic and laboratory data in the context of their medical condition is felt to place  them at decreased risk for further clinical deterioration. Furthermore, it is anticipated that the patient will be medically stable for discharge from the hospital within 2 midnights of admission.   Zada Finders MD [facility]  If 7PM-7AM, please contact night-coverage [email or web address]  05/30/2022, 10:41 PM

## 2022-05-30 NOTE — Consult Note (Signed)
NEURO HOSPITALIST CONSULT NOTE   Requestig physician: Dr. Posey Pronto  Reason for Consult: Acute left thalamocapsular stroke  History obtained from:  Family, Patient and Chart     HPI:                                                                                                                                          Austin Moreno is an 51 y.o. male with a PMHx of HLD, HTN, prediabetes and obesity who presents to the ED with new onset of right facial droop, dysarthria and dysphasia. His symptoms began on Saturday and have remained essentially unchanged since then. BP has been high in the ED. MRI brain reveals an early subacute infarct involving the posterior limb of the internal capsule on the left. Neurology was called to further assess.   He had been evaluated in the ED approximately 1.5 weeks ago for severe HTN associated with headache and vision changes. Per family, he was supposed to have been taking antihypertensive medication but had been noncompliant for the past year. CT head during his ED assessment on 11/15  revealed a few scattered calcifications of varying sizes in both cerebral hemispheres with no interval change since 10/01/2013, suggesting residual change from previous inflammation. His BP was managed during his prior ED visit and he was discharged home on antihypertensive medication which family states he has been compliant with since then.    Past Medical History:  Diagnosis Date   Allergy    Has a nurse friend who gives him a shot every year for his allergies--sounds like corticosteroids   Hyperlipidemia    With good HDL   Hypertension    Obesity (BMI 30-39.9) 2016   Prediabetes     No past surgical history on file.  Family History  Problem Relation Age of Onset   Heart disease Mother        CAD   Heart disease Brother 58       one brother with heart valve problems            Social History:  reports that he has been smoking cigarettes.  He has never used smokeless tobacco. He reports current alcohol use. He reports that he does not use drugs.  No Known Allergies  HOME MEDICATIONS:  No current facility-administered medications on file prior to encounter.   Current Outpatient Medications on File Prior to Encounter  Medication Sig Dispense Refill   acetaminophen (TYLENOL) 500 MG tablet Take 1,000 mg by mouth daily as needed for moderate pain.     amLODipine (NORVASC) 5 MG tablet Take 1 tablet (5 mg total) by mouth daily. 30 tablet 3   aspirin EC 81 MG tablet Take 1 tablet (81 mg total) by mouth daily. Swallow whole. 30 tablet 12   carvedilol (COREG) 12.5 MG tablet Take 1 tablet (12.5 mg total) by mouth 2 (two) times daily with a meal. 60 tablet 3   lisinopril (ZESTRIL) 20 MG tablet Take 1 tablet (20 mg total) by mouth daily. 30 tablet 3   rosuvastatin (CRESTOR) 20 MG tablet Take 1 tablet (20 mg total) by mouth at bedtime. 30 tablet 3     ROS:                                                                                                                                       As per HPI. Does not endorse any additional symptoms in the context of being a poor historian.    Blood pressure (!) 145/96, pulse 64, temperature 98.1 F (36.7 C), temperature source Oral, resp. rate (!) 23, height 5\' 6"  (1.676 m), weight 86.6 kg, SpO2 98 %.   General Examination:                                                                                                       Physical Exam  HEENT-  [place]/AT  Lungs- Respirations unlabored Extremities- No edema   Neurological Examination Mental Status: Awake and alert. Oriented x 5. Speech mildly dysarthric but fluent in the context of sparse verbal output. Able to name a thumb and pinky in Spanish but does not recall the name of an index finger shown to him. Family states that at  baseline his vocabulary is "like that". Comprehension intact for all commands.  Cranial Nerves: II: PERRL. Temporal visual fields intact with no extinction to DSS.   III,IV, VI: Saccadic visual pursuits noted towards the left, but not towards the right. EOM are full. No nystagmus.  V,VII: Right facial droop, lower quadrant VIII: Hearing intact to voice IX,X: No hypophonia or hoarseness XI: Symmetric shoulder shrug XII: Midline tongue extension Motor: RUE 5/5 except for 4+/5 deltoid and biceps.  LUE 5/5 BLE 5/5 without asymmetry Sensory: FT decreased to RUE and RLE.  Temp sensation decreased to RLE.  Deep Tendon Reflexes: 2+ to 3+ and symmetric throughout Plantars: Right: downgoing   Left: downgoing Cerebellar: No ataxia with FNF bilaterally Gait: Deferred   Lab Results: Basic Metabolic Panel: Recent Labs  Lab 05/30/22 1546 05/30/22 1618  NA 137 138  K 4.3 4.3  CL 107 105  CO2 22  --   GLUCOSE 186* 188*  BUN 28* 30*  CREATININE 1.32* 1.40*  CALCIUM 9.4  --     CBC: Recent Labs  Lab 05/30/22 1546 05/30/22 1618  WBC 11.0*  --   NEUTROABS 6.1  --   HGB 13.8 14.6  HCT 41.6 43.0  MCV 87.9  --   PLT 308  --     Cardiac Enzymes: No results for input(s): "CKTOTAL", "CKMB", "CKMBINDEX", "TROPONINI" in the last 168 hours.  Lipid Panel: No results for input(s): "CHOL", "TRIG", "HDL", "CHOLHDL", "VLDL", "LDLCALC" in the last 168 hours.  Imaging: MR BRAIN WO CONTRAST  Result Date: 05/30/2022 CLINICAL DATA:  Stroke follow-up EXAM: MRI HEAD WITHOUT CONTRAST TECHNIQUE: Multiplanar, multiecho pulse sequences of the brain and surrounding structures were obtained without intravenous contrast. 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 Sequela of mild to moderate chronic microvascular ischemic change. T2/FLAIR hyperintense signal is also seen involving the genu of the corpus callosum. Advanced generalized volume loss. Vascular: Normal flow voids. Skull and upper cervical spine: Normal marrow signal. Small disc bulge at C3-C4 that likely results in at least mild spinal canal narrowing. Sinuses/Orbits: Trace left mastoid effusion. Bilateral lens replacement. Other: None. IMPRESSION: 1. 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 No hemorrhage. 2. 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 Electronically Signed   By: Lorenza Cambridge M.D.   On: 05/30/2022 17:46     Assessment: 51 year old male presenting with new onset of right facial droop, dysarthria and mild expressive aphasia - Exam reveals right facial droop, mild dysarthria, subtle motor findings involving the RUE and mild sensory deficits.  - MRI brain: Acute infarct involving the posterior limb of the internal capsule on the left. No hemorrhage. 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 - TTE performed 11/16: LVEF 55 to 60%. The left ventricle has no regional  wall motion abnormalities. There is mild left ventricular hypertrophy. 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   Recommendations: - Full stroke work up to include CTA of head and neck and cardiac telemetry - Continue ASA, statin and antihypertensive medication. Out of the permissive HTN time window. BP goal of  120/80. - HgbA1c, fasting lipid panel - PT consult, OT consult, Speech consult - Risk factor modification to include smoking cessation - Frequent neuro checks - NPO until passes stroke swallow screen - Family advised to record his home BP readings twice per day in a blood pressure diary - Stroke Team to follow in the morning.     Electronically signed: Dr. Caryl Pina 05/30/2022, 11:28 PM

## 2022-05-30 NOTE — Hospital Course (Signed)
Austin Moreno is a 51 y.o. 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

## 2022-05-31 ENCOUNTER — Telehealth: Payer: Self-pay | Admitting: Student

## 2022-05-31 ENCOUNTER — Observation Stay (HOSPITAL_BASED_OUTPATIENT_CLINIC_OR_DEPARTMENT_OTHER): Payer: 59

## 2022-05-31 ENCOUNTER — Other Ambulatory Visit: Payer: Self-pay

## 2022-05-31 ENCOUNTER — Encounter (HOSPITAL_COMMUNITY): Payer: Self-pay | Admitting: Internal Medicine

## 2022-05-31 DIAGNOSIS — I10 Essential (primary) hypertension: Secondary | ICD-10-CM | POA: Diagnosis not present

## 2022-05-31 DIAGNOSIS — E7800 Pure hypercholesterolemia, unspecified: Secondary | ICD-10-CM

## 2022-05-31 DIAGNOSIS — I639 Cerebral infarction, unspecified: Secondary | ICD-10-CM

## 2022-05-31 DIAGNOSIS — G9389 Other specified disorders of brain: Secondary | ICD-10-CM | POA: Diagnosis not present

## 2022-05-31 LAB — CBC
HCT: 42.9 % (ref 39.0–52.0)
Hemoglobin: 13.9 g/dL (ref 13.0–17.0)
MCH: 28.8 pg (ref 26.0–34.0)
MCHC: 32.4 g/dL (ref 30.0–36.0)
MCV: 89 fL (ref 80.0–100.0)
Platelets: 289 10*3/uL (ref 150–400)
RBC: 4.82 MIL/uL (ref 4.22–5.81)
RDW: 13.3 % (ref 11.5–15.5)
WBC: 11.7 10*3/uL — ABNORMAL HIGH (ref 4.0–10.5)
nRBC: 0 % (ref 0.0–0.2)

## 2022-05-31 LAB — BASIC METABOLIC PANEL
Anion gap: 5 (ref 5–15)
BUN: 24 mg/dL — ABNORMAL HIGH (ref 6–20)
CO2: 24 mmol/L (ref 22–32)
Calcium: 8.6 mg/dL — ABNORMAL LOW (ref 8.9–10.3)
Chloride: 107 mmol/L (ref 98–111)
Creatinine, Ser: 1.22 mg/dL (ref 0.61–1.24)
GFR, Estimated: >60 mL/min (ref >60)
Glucose, Bld: 117 mg/dL — ABNORMAL HIGH (ref 70–99)
Potassium: 3.8 mmol/L (ref 3.5–5.1)
Sodium: 136 mmol/L (ref 135–145)

## 2022-05-31 LAB — LIPID PANEL
Cholesterol: 133 mg/dL (ref 0–200)
HDL: 44 mg/dL (ref >40)
Total CHOL/HDL Ratio: 3 RATIO
Triglycerides: 87 mg/dL (ref <150)
VLDL: 17 mg/dL (ref 0–40)

## 2022-05-31 MED ORDER — SODIUM CHLORIDE 0.9 % IV SOLN: INTRAVENOUS | Status: DC

## 2022-05-31 MED ORDER — IOHEXOL 350 MG/ML SOLN
75.0000 mL | Freq: Once | INTRAVENOUS | Status: AC | PRN
  Administered 2022-05-31: 75 mL via INTRAVENOUS

## 2022-05-31 NOTE — Progress Notes (Signed)
    [facility] has been requested to perform a transesophageal echocardiogram on 11/29 for Stroke.  After careful review of history and examination, the risks and benefits of transesophageal echocardiogram have been explained including risks of esophageal damage, perforation (1:10,000 risk), bleeding, pharyngeal hematoma as well as other potential complications associated with conscious sedation including aspiration, arrhythmia, respiratory failure and death. Alternatives to treatment were discussed, questions were answered. Patient is willing to proceed. Spanish interpretor [id number] used during interview.   Laverda Page, NP-C 05/31/2022 5:21 PM

## 2022-05-31 NOTE — H&P (View-Only) (Signed)
STROKE TEAM PROGRESS NOTE   SUBJECTIVE (INTERVAL HISTORY) His wife is at the bedside.  Overall his condition is gradually improving. He still has mild right facial droop and slight dysarthria and left hand dysmetria. MRI showed 3 scattered infarcts bilaterally. Recommended TEE.   OBJECTIVE Temp:  [97.5 F (36.4 C)-98.3 F (36.8 C)] 98.1 F (36.7 C) (11/28 1537) Pulse Rate:  [61-77] 69 (11/28 1537) Cardiac Rhythm: Normal sinus rhythm (11/28 1313) Resp:  [13-27] 17 (11/28 1537) BP: (123-167)/(80-128) 140/96 (11/28 1537) SpO2:  [94 %-100 %] 100 % (11/28 1537)  Recent Labs  Lab 05/30/22 1536  GLUCAP 200*   Recent Labs  Lab 05/30/22 1546 05/30/22 1618 05/31/22 0512  NA 137 138 136  K 4.3 4.3 3.8  CL 107 105 107  CO2 22  --  24  GLUCOSE 186* 188* 117*  BUN 28* 30* 24*  CREATININE 1.32* 1.40* 1.22  CALCIUM 9.4  --  8.6*   Recent Labs  Lab 05/30/22 1546  AST 28  ALT 40  ALKPHOS 48  BILITOT 0.4  PROT 7.7  ALBUMIN 3.1*   Recent Labs  Lab 05/30/22 1546 05/30/22 1618 05/31/22 0512  WBC 11.0*  --  11.7*  NEUTROABS 6.1  --   --   HGB 13.8 14.6 13.9  HCT 41.6 43.0 42.9  MCV 87.9  --  89.0  PLT 308  --  289   No results for input(s): "CKTOTAL", "CKMB", "CKMBINDEX", "TROPONINI" in the last 168 hours. No results for input(s): "LABPROT", "INR" in the last 72 hours. Recent Labs    05/30/22 2325  COLORURINE YELLOW  LABSPEC 1.020  PHURINE 6.0  GLUCOSEU NEGATIVE  HGBUR NEGATIVE  BILIRUBINUR NEGATIVE  KETONESUR NEGATIVE  PROTEINUR NEGATIVE  NITRITE NEGATIVE  LEUKOCYTESUR NEGATIVE       Component Value Date/Time   CHOL 133 05/31/2022 0512   CHOL 180 11/29/2016 0843   TRIG 87 05/31/2022 0512   HDL 44 05/31/2022 0512   HDL 47 11/29/2016 0843   CHOLHDL 3.0 05/31/2022 0512   VLDL 17 05/31/2022 0512   LDLCALC NOT CALCULATED 05/31/2022 0512   LDLCALC 110 (H) 11/29/2016 0843   Lab Results  Component Value Date   HGBA1C 6.4 (H) 05/19/2022      Component Value  Date/Time   LABOPIA NONE DETECTED 05/30/2022 2325   COCAINSCRNUR NONE DETECTED 05/30/2022 2325   LABBENZ NONE DETECTED 05/30/2022 2325   AMPHETMU NONE DETECTED 05/30/2022 2325   THCU NONE DETECTED 05/30/2022 2325   LABBARB NONE DETECTED 05/30/2022 2325    Recent Labs  Lab 05/30/22 1546  ETH <10    I have personally reviewed the radiological images below and agree with the radiology interpretations.  MR BRAIN WO CONTRAST  Addendum Date: 05/31/2022   ADDENDUM REPORT: 05/31/2022 08:58 ADDENDUM: On further review there are additional sites diffusion restriction in the anterior right temporal lobe and the left thalamus. 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 Electronically Signed   By: Marin Roberts M.D.   On: 05/31/2022 08:58   Result Date: 05/31/2022 CLINICAL DATA:  Stroke follow-up EXAM: MRI HEAD WITHOUT CONTRAST TECHNIQUE: Multiplanar, multiecho pulse sequences of the brain and surrounding structures were obtained without intravenous contrast. 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 Sequela of mild to  moderate chronic microvascular ischemic change. T2/FLAIR hyperintense signal is also seen involving the genu of the corpus callosum. Advanced generalized volume loss. Vascular: Normal flow voids. Skull and upper cervical spine: Normal marrow signal. Small disc bulge at C3-C4 that likely results in at least mild spinal canal narrowing. Sinuses/Orbits: Trace left mastoid effusion. Bilateral lens replacement. Other: None. IMPRESSION: 1. 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 No hemorrhage. 2. 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 Electronically Signed: By: Marin Roberts M.D. On: 05/30/2022 17:46   CT ANGIO HEAD NECK W WO CM  Result Date: 05/31/2022 CLINICAL DATA:  Right facial droop EXAM: CT ANGIOGRAPHY HEAD AND NECK TECHNIQUE: Multidetector CT imaging of the head and neck was performed using the standard protocol during bolus administration of intravenous contrast. Multiplanar CT image reconstructions and MIPs were obtained to evaluate the vascular anatomy. Carotid stenosis measurements (when applicable) are obtained utilizing NASCET criteria, using the distal internal carotid diameter as the denominator. RADIATION DOSE REDUCTION: This exam was performed according to the departmental dose-optimization program which includes automated exposure control, adjustment of the mA and/or kV according to patient size and/or use of iterative reconstruction technique. CONTRAST:  10mL OMNIPAQUE IOHEXOL 350 MG/ML SOLN COMPARISON:  05/30/2022 brain MRI FINDINGS: CT HEAD FINDINGS Brain: There is no mass, hemorrhage or extra-axial collection. The size and configuration of the ventricles and extra-axial CSF spaces are normal. 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 Multifocal parenchymal calcification. Skull: The visualized skull base, calvarium and extracranial soft tissues are normal. Sinuses/Orbits: No fluid levels or advanced mucosal thickening of the visualized paranasal sinuses. No mastoid or middle ear effusion. The orbits are normal. CTA NECK FINDINGS SKELETON: There is no bony spinal canal stenosis. No lytic or blastic lesion. OTHER NECK: Normal pharynx, larynx and major salivary glands. No cervical lymphadenopathy. Unremarkable thyroid gland. UPPER CHEST: No pneumothorax or pleural effusion. No nodules or masses. AORTIC ARCH: There is calcific atherosclerosis of the aortic arch. There is no aneurysm,  dissection or hemodynamically significant stenosis of the visualized portion of the aorta. Conventional 3 vessel aortic branching pattern. The visualized proximal subclavian arteries are widely patent. RIGHT CAROTID SYSTEM: No dissection, occlusion or aneurysm. There is mixed density atherosclerosis extending into the proximal ICA, resulting in less than 50% stenosis. There is irregular noncalcified plaque along the wall of the distal ICA to the level of the skull base LEFT CAROTID SYSTEM: No dissection, occlusion or aneurysm. There is mixed density atherosclerosis extending into the proximal ICA, resulting in 50% stenosis. VERTEBRAL ARTERIES: Left dominant configuration. Both origins are clearly patent. There is no dissection, occlusion or flow-limiting stenosis to the skull base (V1-V3 segments). CTA HEAD FINDINGS POSTERIOR CIRCULATION: --Vertebral arteries: Normal V4 segments. --Inferior cerebellar arteries: Normal. --Basilar artery: Normal. --Superior cerebellar arteries: Normal. --Posterior cerebral arteries (PCA): Normal. ANTERIOR CIRCULATION: --Intracranial internal carotid arteries: Normal. --Anterior cerebral arteries (ACA): Atherosclerotic irregularity of both anterior cerebral arteries --Middle cerebral arteries (MCA): Normal. VENOUS SINUSES: As permitted by contrast timing, patent. ANATOMIC VARIANTS: None Review of the MIP images confirms the above findings. IMPRESSION: 1. No emergent large vessel occlusion or high-grade stenosis of the intracranial arteries. 2. 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 3. 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 Irregular noncalcified plaque of the distal right internal carotid artery to the level of the skull  base, which could indicate a source of embolic disease. Electronically Signed   By: Ulyses Jarred M.D.   On: 05/31/2022 00:47   ECHOCARDIOGRAM COMPLETE  Result Date: 05/19/2022    ECHOCARDIOGRAM  REPORT   Patient Name:   JAIVIEN MERLOS Date of Exam: 05/19/2022 Medical Rec #:  [id number]           Height:       62.0 in Accession #:    [id number]          Weight:       206.0 lb Date of Birth:  Nov 24, 1970           BSA:          1.936 m Patient Age:    55 years            BP:           142/89 mmHg Patient Gender: M                   HR:           79 bpm. Exam Location:  Inpatient Procedure: 2D Echo, Cardiac Doppler, Color Doppler and Intracardiac            Opacification Agent Indications:    Chest pain  History:        Patient has no prior history of Echocardiogram examinations.                 Risk Factors:Hypertension and Dyslipidemia.  Sonographer:    Clayton Lefort RDCS (AE) Referring Phys: [id number] BELAL A SULEIMAN  Sonographer Comments: Suboptimal apical window. IMPRESSIONS  1. Left ventricular ejection fraction, by estimation, is 55 to 60%. The left ventricle has normal function. The left ventricle has no regional wall motion abnormalities. There is mild left ventricular hypertrophy. Left ventricular diastolic parameters are indeterminate.  2. Right ventricular systolic function is normal. The right ventricular size is normal.  3. Mild mitral valve regurgitation. Moderate mitral annular calcification.  4. The aortic valve is tricuspid. Aortic valve regurgitation is not visualized.  5. The inferior vena cava is dilated in size with <50% respiratory variability, suggesting right atrial pressure of 15 mmHg. FINDINGS  Left Ventricle: Left ventricular ejection fraction, by estimation, is 55 to 60%. The left ventricle has normal function. The left ventricle has no regional wall motion abnormalities. Definity contrast agent was given IV to delineate the left ventricular  endocardial borders. The left ventricular internal cavity size was normal in size. There is mild left ventricular hypertrophy. Left ventricular diastolic parameters are indeterminate. Right Ventricle: The right ventricular size is normal. Right  vetricular wall thickness was not assessed. Right ventricular systolic function is normal. Left Atrium: Left atrial size was normal in size. Right Atrium: Right atrial size was normal in size. Pericardium: There is no evidence of pericardial effusion. Mitral Valve: There is mild thickening of the mitral valve leaflet(s). Moderate mitral annular calcification. Mild mitral valve regurgitation. MV peak gradient, 12.8 mmHg. The mean mitral valve gradient is 4.0 mmHg. Tricuspid Valve: The tricuspid valve is normal in structure. Tricuspid valve regurgitation is trivial. Aortic Valve: The aortic valve is tricuspid. Aortic valve regurgitation is not visualized. Aortic valve mean gradient measures 3.0 mmHg. Aortic valve peak gradient measures 6.6 mmHg. Aortic valve area, by VTI measures 2.62 cm. Pulmonic Valve: The pulmonic valve was grossly normal. Pulmonic valve regurgitation is not visualized. No evidence of pulmonic stenosis. Aorta: The aortic root is normal in size and structure.  Venous: The inferior vena cava is dilated in size with less than 50% respiratory variability, suggesting right atrial pressure of 15 mmHg. IAS/Shunts: No atrial level shunt detected by color flow Doppler.  LEFT VENTRICLE PLAX 2D LVIDd:         4.50 cm   Diastology LVIDs:         3.10 cm   LV e' medial:    4.13 cm/s LV PW:         1.30 cm   LV E/e' medial:  42.1 LV IVS:        1.50 cm   LV e' lateral:   8.27 cm/s LVOT diam:     2.10 cm   LV E/e' lateral: 21.0 LV SV:         59 LV SV Index:   30 LVOT Area:     3.46 cm  RIGHT VENTRICLE             IVC RV Basal diam:  3.30 cm     IVC diam: 2.60 cm RV S prime:     13.30 cm/s TAPSE (M-mode): 1.2 cm LEFT ATRIUM             Index        RIGHT ATRIUM           Index LA diam:        4.30 cm 2.22 cm/m   RA Area:     19.50 cm LA Vol (A2C):   75.7 ml 39.10 ml/m  RA Volume:   54.00 ml  27.89 ml/m LA Vol (A4C):   74.6 ml 38.54 ml/m LA Biplane Vol: 79.5 ml 41.07 ml/m  AORTIC VALVE AV Area (Vmax):     2.58 cm AV Area (Vmean):   2.60 cm AV Area (VTI):     2.62 cm AV Vmax:           128.00 cm/s AV Vmean:          82.600 cm/s AV VTI:            0.223 m AV Peak Grad:      6.6 mmHg AV Mean Grad:      3.0 mmHg LVOT Vmax:         95.50 cm/s LVOT Vmean:        61.900 cm/s LVOT VTI:          0.169 m LVOT/AV VTI ratio: 0.76  AORTA Ao Root diam: 3.20 cm MITRAL VALVE MV Area (PHT): 3.79 cm     SHUNTS MV Area VTI:   1.61 cm     Systemic VTI:  0.17 m MV Peak grad:  12.8 mmHg    Systemic Diam: 2.10 cm MV Mean grad:  4.0 mmHg MV Vmax:       1.79 m/s MV Vmean:      92.3 cm/s MV Decel Time: 200 msec MV E velocity: 174.00 cm/s MV A velocity: 53.10 cm/s MV E/A ratio:  3.28 Dorris Carnes MD Electronically signed by Dorris Carnes MD Signature Date/Time: 05/19/2022/12:40:27 PM    Final    CT Angio Chest/Abd/Pel for Dissection W and/or W/WO  Result Date: 05/18/2022 CLINICAL DATA:  Head and stomach pain since yesterday, hypertension EXAM: CT ANGIOGRAPHY CHEST, ABDOMEN AND PELVIS TECHNIQUE: Non-contrast CT of the chest was initially obtained. Multidetector CT imaging through the chest, abdomen and pelvis was performed using the standard protocol during bolus administration of intravenous contrast. Multiplanar reconstructed images and MIPs were obtained and reviewed to evaluate the  vascular anatomy. RADIATION DOSE REDUCTION: This exam was performed according to the departmental dose-optimization program which includes automated exposure control, adjustment of the mA and/or kV according to patient size and/or use of iterative reconstruction technique. CONTRAST:  14mL OMNIPAQUE IOHEXOL 350 MG/ML SOLN COMPARISON:  08/29/2018 FINDINGS: CTA CHEST FINDINGS Cardiovascular: No evidence of thoracic aortic aneurysm or dissection. Mild cardiomegaly without pericardial effusion. Atherosclerosis of the coronary vasculature. Mediastinum/Nodes: No enlarged mediastinal, hilar, or axillary lymph nodes. Thyroid gland, trachea, and esophagus demonstrate  no significant findings. Lungs/Pleura: Trace bilateral pleural effusions, right greater than left. No airspace disease or pneumothorax. Central airways are patent. Musculoskeletal: There are no acute or destructive bony lesions. Reconstructed images demonstrate no additional findings. Review of the MIP images confirms the above findings. CTA ABDOMEN AND PELVIS FINDINGS VASCULAR Aorta: Normal caliber aorta without aneurysm, dissection, vasculitis or significant stenosis. Atherosclerosis of the distal aorta. Celiac: Patent without evidence of aneurysm, dissection, vasculitis or significant stenosis. SMA: Patent without evidence of aneurysm, dissection, vasculitis or significant stenosis. Renals: Both renal arteries are patent without evidence of aneurysm, dissection, vasculitis, fibromuscular dysplasia or significant stenosis. IMA: Patent without evidence of aneurysm, dissection, vasculitis or significant stenosis. Inflow: Patent without evidence of aneurysm, dissection, vasculitis or significant stenosis. Veins: No obvious venous abnormality within the limitations of this arterial phase study. Review of the MIP images confirms the above findings. NON-VASCULAR Hepatobiliary: Nodularity of the liver capsule and hypertrophy of the left lobe liver consistent with cirrhosis. No focal parenchymal liver abnormality. Gallbladder is unremarkable. Pancreas: Unremarkable. No pancreatic ductal dilatation or surrounding inflammatory changes. Spleen: Normal in size without focal abnormality. Adrenals/Urinary Tract: Adrenal glands are unremarkable. Kidneys are normal, without renal calculi, focal lesion, or hydronephrosis. Bladder is unremarkable. Stomach/Bowel: No bowel obstruction or ileus. Normal appendix right lower quadrant. No bowel wall thickening or inflammatory change. Lymphatic: No pathologic adenopathy. Reproductive: Prostate is unremarkable. Other: No free fluid or free intraperitoneal gas. No abdominal wall hernia.  Musculoskeletal: No acute or destructive bony lesions. Reconstructed images demonstrate no additional findings. Review of the MIP images confirms the above findings. IMPRESSION: 1. No evidence of thoracoabdominal aortic aneurysm or dissection. 2. Trace bilateral pleural effusions, right greater than left. 3. Nodular liver capsule and left lobe hypertrophy compatible with cirrhosis. 4.  Aortic Atherosclerosis (ICD10-I70.0). Electronically Signed   By: Randa Ngo M.D.   On: 05/18/2022 20:39   CT Head Wo Contrast  Result Date: 05/18/2022 CLINICAL DATA:  Sudden severe headaches EXAM: CT HEAD WITHOUT CONTRAST TECHNIQUE: Contiguous axial images were obtained from the base of the skull through the vertex without intravenous contrast. RADIATION DOSE REDUCTION: This exam was performed according to the departmental dose-optimization program which includes automated exposure control, adjustment of the mA and/or kV according to patient size and/or use of iterative reconstruction technique. COMPARISON:  10/01/2013 FINDINGS: Brain: No acute intracranial findings are seen. There are no signs of bleeding within the cranium. Ventricles are not dilated. There are multiple calcifications in both cerebral hemispheres with no significant interval change. There is no adjacent edema or mass effect. This finding may suggest presence of multiple healed granulomas or residual changes from some other inflammatory process such as cysticercosis. Vascular: Unremarkable. Skull: There is decreased in number of air cells in mastoids. No fracture is seen. Sinuses/Orbits: No acute findings are seen. Other: No significant interval changes are noted. IMPRESSION: No acute intracranial findings are seen in noncontrast CT brain. There are few scattered calcifications of varying sizes in both cerebral hemispheres with no  interval change since 10/01/2013 suggesting residual change from previous inflammation. Electronically Signed   By: Ernie Avena M.D.   On: 05/18/2022 12:23     PHYSICAL EXAM  Temp:  [97.5 F (36.4 C)-98.3 F (36.8 C)] 98.1 F (36.7 C) (11/28 1537) Pulse Rate:  [61-77] 69 (11/28 1537) Resp:  [13-27] 17 (11/28 1537) BP: (123-167)/(80-128) 140/96 (11/28 1537) SpO2:  [94 %-100 %] 100 % (11/28 1537)  General - Well nourished, well developed, in no apparent distress.  Ophthalmologic - fundi not visualized due to noncooperation.  Cardiovascular - Regular rhythm and rate.  Mental Status -  Level of arousal and orientation to time, place, and person were intact. Language including expression, naming, repetition, comprehension was assessed and found intact. Slight dysarthria Fund of Knowledge was assessed and was intact.  Cranial Nerves II - XII - II - Visual field intact OU. III, IV, VI - Extraocular movements intact. V - Facial sensation intact bilaterally. VII - mild right facial droop. VIII - Hearing & vestibular intact bilaterally. X - Palate elevates symmetrically. Slight dysarthria XI - Chin turning & shoulder shrug intact bilaterally. XII - Tongue protrusion intact.  Motor Strength - The patient's strength was normal in all extremities and pronator drift was absent.  Bulk was normal and fasciculations were absent.   Motor Tone - Muscle tone was assessed at the neck and appendages and was normal.  Reflexes - The patient's reflexes were symmetrical in all extremities and he had no pathological reflexes.  Sensory - Light touch, temperature/pinprick were assessed and were symmetrical.    Coordination - The patient had normal movements in the left hand and feet with no ataxia or dysmetria. Right FTN dysmetria. Tremor was absent.  Gait and Station - deferred.   ASSESSMENT/PLAN [name] is a 51 y.o. male with history of hypertension, hyperlipidemia, prediabetes, recent hypertensive emergency admission now admitted for right facial droop, slurred speech. No tPA given due to  outside window.    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 Patient counseled to be compliant with his antithrombotic medications Ongoing aggressive stroke risk factor management Therapy recommendations: None Disposition: Pending  Hypertension Recent admission for hypertensive emergency Stable on the high and Gradually normalized within 5-7 days. On amlodipine 5 and Coreg 12.5 Long term BP goal normotensive  Hyperlipidemia Home meds: Crestor 20 LDL 72, goal < 70 Now on Crestor 20 Continue statin at discharge  Other Stroke Risk Factors Obesity, Body mass index is 30.83 kg/m.   Other Active Problems AKI, creatinine 1.40-1.22 Leukocytosis WBC 11.0-11.7  Hospital day # 0    Marvel Plan, MD PhD Stroke Neurology 05/31/2022 4:14 PM    To contact Stroke Continuity provider, please refer to [email or web address]. After hours, contact General Neurology

## 2022-05-31 NOTE — ED Notes (Addendum)
Occupational therapy at bedside, pt ambulatory with OT

## 2022-05-31 NOTE — ED Notes (Signed)
ED TO INPATIENT HANDOFF REPORT  ED Nurse Name and Phone #: Joaquin Courts [phone]  S Name/Age/Gender Austin Moreno 51 y.o. male Room/Bed: 030C/030C  Code Status   Code Status: Full Code  Home/SNF/Other Home Patient oriented to: self, place, time, and situation Is this baseline? Yes   Triage Complete: Triage complete  Chief Complaint Acute CVA (cerebrovascular accident) [facility]) [I63.9]  Triage Note Pt to ED c/o facial droop and difficulty with speech since Friday night.  [place]allee spanish interpretor used to assist with assessment. PA at bedside during this assessment.    Allergies No Known Allergies  Level of Care/Admitting Diagnosis ED Disposition     ED Disposition  Admit   Condition  --   Comment  Hospital Area: [facility] [[id number]]  Level of Care: Telemetry Medical [104]  May place patient in observation at [facility] or [facility] if equivalent level of care is available:: No  Covid Evaluation: Asymptomatic - no recent exposure (last 10 days) testing not required  Diagnosis: Acute CVA (cerebrovascular accident) [facility]) [[id number]]  Admitting Physician: Charlsie Quest [[id number]]  Attending Physician: Charlsie Quest [[id number]]          B Medical/Surgery History Past Medical History:  Diagnosis Date   Allergy    Has a nurse friend who gives him a shot every year for his allergies--sounds like corticosteroids   Hyperlipidemia    With good HDL   Hypertension    Obesity (BMI 30-39.9) 2016   Prediabetes    History reviewed. No pertinent surgical history.   A IV Location/Drains/Wounds Patient Lines/Drains/Airways Status     Active Line/Drains/Airways     Name Placement date Placement time Site Days   Peripheral IV 05/18/22 18 G Anterior;Left;Proximal Forearm 05/18/22  1735  Forearm  13   Peripheral IV 05/30/22 18 G Right Antecubital 05/30/22  2213  Antecubital  1            Intake/Output Last 24 hours No intake or output data in  the 24 hours ending 05/31/22 1158  Labs/Imaging Results for orders placed or performed during the hospital encounter of 05/30/22 (from the past 48 hour(s))  CBG monitoring, ED     Status: Abnormal   Collection Time: 05/30/22  3:36 PM  Result Value Ref Range   Glucose-Capillary 200 (H) 70 - 99 mg/dL    Comment: Glucose reference range applies only to samples taken after fasting for at least 8 hours.  Ethanol     Status: None   Collection Time: 05/30/22  3:46 PM  Result Value Ref Range   Alcohol, Ethyl (B) <10 <10 mg/dL    Comment: (NOTE) Lowest detectable limit for serum alcohol is 10 mg/dL.  For medical purposes only. Performed at [facility], [address], [place], [place] [address]   CBC     Status: Abnormal   Collection Time: 05/30/22  3:46 PM  Result Value Ref Range   WBC 11.0 (H) 4.0 - 10.5 K/uL   RBC 4.73 4.22 - 5.81 MIL/uL   Hemoglobin 13.8 13.0 - 17.0 g/dL   HCT 96.2 22.9 - 79.8 %   MCV 87.9 80.0 - 100.0 fL   MCH 29.2 26.0 - 34.0 pg   MCHC 33.2 30.0 - 36.0 g/dL   RDW 92.1 19.4 - 17.4 %   Platelets 308 150 - 400 K/uL   nRBC 0.0 0.0 - 0.2 %    Comment: Performed at [facility], [address],  [place], [place] [address]  Differential     Status: Abnormal   Collection Time: 05/30/22  3:46 PM  Result Value Ref Range   Neutrophils Relative % 56 %   Neutro Abs 6.1 1.7 - 7.7 K/uL   Lymphocytes Relative 32 %   Lymphs Abs 3.5 0.7 - 4.0 K/uL   Monocytes Relative 9 %   Monocytes Absolute 1.0 0.1 - 1.0 K/uL   Eosinophils Relative 2 %   Eosinophils Absolute 0.2 0.0 - 0.5 K/uL   Basophils Relative 1 %   Basophils Absolute 0.2 (H) 0.0 - 0.1 K/uL   Immature Granulocytes 0 %   Abs Immature Granulocytes 0.03 0.00 - 0.07 K/uL    Comment: Performed at [facility], [address], [place], [place] [address]  Comprehensive metabolic panel     Status: Abnormal   Collection Time: 05/30/22  3:46 PM  Result Value Ref Range   Sodium 137 135 - 145 mmol/L    Potassium 4.3 3.5 - 5.1 mmol/L   Chloride 107 98 - 111 mmol/L   CO2 22 22 - 32 mmol/L   Glucose, Bld 186 (H) 70 - 99 mg/dL    Comment: Glucose reference range applies only to samples taken after fasting for at least 8 hours.   BUN 28 (H) 6 - 20 mg/dL   Creatinine, Ser 0.76 (H) 0.61 - 1.24 mg/dL   Calcium 9.4 8.9 - 15.1 mg/dL   Total Protein 7.7 6.5 - 8.1 g/dL   Albumin 3.1 (L) 3.5 - 5.0 g/dL   AST 28 15 - 41 U/L   ALT 40 0 - 44 U/L   Alkaline Phosphatase 48 38 - 126 U/L   Total Bilirubin 0.4 0.3 - 1.2 mg/dL   GFR, Estimated >83 >43 mL/min    Comment: (NOTE) Calculated using the CKD-EPI Creatinine Equation (2021)    Anion gap 8 5 - 15    Comment: Performed at [facility], [address] [place], [place] [address]  I-stat chem 8, ED     Status: Abnormal   Collection Time: 05/30/22  4:18 PM  Result Value Ref Range   Sodium 138 135 - 145 mmol/L   Potassium 4.3 3.5 - 5.1 mmol/L   Chloride 105 98 - 111 mmol/L   BUN 30 (H) 6 - 20 mg/dL   Creatinine, Ser 9.78 (H) 0.61 - 1.24 mg/dL   Glucose, Bld 478 (H) 70 - 99 mg/dL    Comment: Glucose reference range applies only to samples taken after fasting for at least 8 hours.   Calcium, Ion 1.19 1.15 - 1.40 mmol/L   TCO2 23 22 - 32 mmol/L   Hemoglobin 14.6 13.0 - 17.0 g/dL   HCT 41.2 82.0 - 81.3 %  Urine rapid drug screen (hosp performed)     Status: None   Collection Time: 05/30/22 11:25 PM  Result Value Ref Range   Opiates NONE DETECTED NONE DETECTED   Cocaine NONE DETECTED NONE DETECTED   Benzodiazepines NONE DETECTED NONE DETECTED   Amphetamines NONE DETECTED NONE DETECTED   Tetrahydrocannabinol NONE DETECTED NONE DETECTED   Barbiturates NONE DETECTED NONE DETECTED    Comment: (NOTE) DRUG SCREEN FOR MEDICAL PURPOSES ONLY.  IF CONFIRMATION IS NEEDED FOR ANY PURPOSE, NOTIFY LAB WITHIN 5 DAYS.  LOWEST DETECTABLE LIMITS FOR URINE DRUG SCREEN Drug Class                     Cutoff (ng/mL) Amphetamine and metabolites  1000 Barbiturate and metabolites    200 Benzodiazepine                 200 Opiates and metabolites        300 Cocaine and metabolites        300 THC                            50 Performed at [facility], [place] [address], [place], [place] [address]   Urinalysis, Routine w reflex microscopic     Status: None   Collection Time: 05/30/22 11:25 PM  Result Value Ref Range   Color, Urine YELLOW YELLOW   APPearance CLEAR CLEAR   Specific Gravity, Urine 1.020 1.005 - 1.030   pH 6.0 5.0 - 8.0   Glucose, UA NEGATIVE NEGATIVE mg/dL   Hgb urine dipstick NEGATIVE NEGATIVE   Bilirubin Urine NEGATIVE NEGATIVE   Ketones, ur NEGATIVE NEGATIVE mg/dL   Protein, ur NEGATIVE NEGATIVE mg/dL   Nitrite NEGATIVE NEGATIVE   Leukocytes,Ua NEGATIVE NEGATIVE    Comment: Microscopic not done on urines with negative protein, blood, leukocytes, nitrite, or glucose < 500 mg/dL. Performed at [facility], [place] [address] [place], [place] [id number]   Basic metabolic panel     Status: Abnormal   Collection Time: 05/31/22  5:12 AM  Result Value Ref Range   Sodium 136 135 - 145 mmol/L   Potassium 3.8 3.5 - 5.1 mmol/L   Chloride 107 98 - 111 mmol/L   CO2 24 22 - 32 mmol/L   Glucose, Bld 117 (H) 70 - 99 mg/dL    Comment: Glucose reference range applies only to samples taken after fasting for at least 8 hours.   BUN 24 (H) 6 - 20 mg/dL   Creatinine, Ser 1.22 0.61 - 1.24 mg/dL   Calcium 8.6 (L) 8.9 - 10.3 mg/dL   GFR, Estimated >60 >60 mL/min    Comment: (NOTE) Calculated using the CKD-EPI Creatinine Equation (2021)    Anion gap 5 5 - 15    Comment: Performed at [facility] [address] [place], [place] [address]  CBC     Status: Abnormal   Collection Time: 05/31/22  5:12 AM  Result Value Ref Range   WBC 11.7 (H) 4.0 - 10.5 K/uL   RBC 4.82 4.22 - 5.81 MIL/uL   Hemoglobin 13.9 13.0 - 17.0 g/dL   HCT 42.9 39.0 - 52.0 %   MCV 89.0 80.0 - 100.0 fL   MCH 28.8 26.0 - 34.0 pg   MCHC 32.4  30.0 - 36.0 g/dL   RDW 13.3 11.5 - 15.5 %   Platelets 289 150 - 400 K/uL   nRBC 0.0 0.0 - 0.2 %    Comment: Performed at [facility], [place] [address] [place], [place] [address]  Lipid panel     Status: None   Collection Time: 05/31/22  5:12 AM  Result Value Ref Range   Cholesterol 133 0 - 200 mg/dL   Triglycerides 87 <150 mg/dL   HDL 44 >40 mg/dL   Total CHOL/HDL Ratio 3.0 RATIO   VLDL 17 0 - 40 mg/dL   LDL Cholesterol NOT CALCULATED 0 - 99 mg/dL    Comment: Performed at [facility] [address], [place], [place] [address]   MR BRAIN WO CONTRAST  Addendum Date: 05/31/2022   ADDENDUM REPORT: 05/31/2022 08:58 ADDENDUM: On further review  there are additional sites diffusion restriction in the anterior right temporal lobe and the left thalamus. 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 Electronically Signed   By: Marin Roberts M.D.   On: 05/31/2022 08:58   Result Date: 05/31/2022 CLINICAL DATA:  Stroke follow-up EXAM: MRI HEAD WITHOUT CONTRAST TECHNIQUE: Multiplanar, multiecho pulse sequences of the brain and surrounding structures were obtained without intravenous contrast. 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 Sequela of mild to moderate chronic microvascular ischemic change. T2/FLAIR hyperintense signal is also seen involving the genu of the corpus callosum. Advanced generalized volume loss. Vascular: Normal flow voids. Skull and upper cervical spine: Normal marrow signal. Small disc bulge at C3-C4 that likely results in at least mild spinal canal narrowing. Sinuses/Orbits: Trace left mastoid effusion. Bilateral lens replacement. Other: None. IMPRESSION: 1. Acute infarct  involving the posterior limb of the internal capsule on the left. No hemorrhage. 2. 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 Electronically Signed: By: Marin Roberts M.D. On: 05/30/2022 17:46   CT ANGIO HEAD NECK W WO CM  Result Date: 05/31/2022 CLINICAL DATA:  Right facial droop EXAM: CT ANGIOGRAPHY HEAD AND NECK TECHNIQUE: Multidetector CT imaging of the head and neck was performed using the standard protocol during bolus administration of intravenous contrast. Multiplanar CT image reconstructions and MIPs were obtained to evaluate the vascular anatomy. Carotid stenosis measurements (when applicable) are obtained utilizing NASCET criteria, using the distal internal carotid diameter as the denominator. RADIATION DOSE REDUCTION: This exam was performed according to the departmental dose-optimization program which includes automated exposure control, adjustment of the mA and/or kV according to patient size and/or use of iterative reconstruction technique. CONTRAST:  8mL OMNIPAQUE IOHEXOL 350 MG/ML SOLN COMPARISON:  05/30/2022 brain MRI FINDINGS: CT HEAD FINDINGS Brain: There is no mass, hemorrhage or extra-axial collection. The size and configuration of the ventricles and extra-axial CSF spaces are normal. 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 Multifocal parenchymal calcification. Skull: The visualized skull base, calvarium and extracranial soft tissues are normal. Sinuses/Orbits: No fluid levels or advanced mucosal thickening of the visualized paranasal sinuses. No mastoid or middle ear effusion. The orbits are normal. CTA NECK FINDINGS SKELETON: There is no bony spinal canal stenosis. No lytic or blastic lesion. OTHER NECK: Normal pharynx, larynx and major salivary glands. No cervical lymphadenopathy. Unremarkable thyroid gland. UPPER CHEST: No  pneumothorax or pleural effusion. No nodules or masses. AORTIC ARCH: There is calcific atherosclerosis of the aortic arch. There is no aneurysm, dissection or hemodynamically significant stenosis of the visualized portion of the aorta. Conventional 3 vessel aortic branching pattern. The visualized proximal subclavian arteries are widely patent. RIGHT CAROTID SYSTEM: No dissection, occlusion or aneurysm. There is mixed density atherosclerosis extending into the proximal ICA, resulting in less than 50% stenosis. There is irregular noncalcified plaque along the wall of the distal ICA to the level of the skull base LEFT CAROTID SYSTEM: No dissection, occlusion or aneurysm. There is mixed density atherosclerosis extending into the proximal ICA, resulting in 50% stenosis. VERTEBRAL ARTERIES: Left dominant configuration. Both origins are clearly patent. There is no dissection, occlusion or flow-limiting stenosis to the skull base (V1-V3 segments). CTA HEAD  FINDINGS POSTERIOR CIRCULATION: --Vertebral arteries: Normal V4 segments. --Inferior cerebellar arteries: Normal. --Basilar artery: Normal. --Superior cerebellar arteries: Normal. --Posterior cerebral arteries (PCA): Normal. ANTERIOR CIRCULATION: --Intracranial internal carotid arteries: Normal. --Anterior cerebral arteries (ACA): Atherosclerotic irregularity of both anterior cerebral arteries --Middle cerebral arteries (MCA): Normal. VENOUS SINUSES: As permitted by contrast timing, patent. ANATOMIC VARIANTS: None Review of the MIP images confirms the above findings. IMPRESSION: 1. No emergent large vessel occlusion or high-grade stenosis of the intracranial arteries. 2. 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 3. 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 Irregular noncalcified plaque of the distal right internal carotid artery to the level of the skull base, which could indicate a source of embolic disease.  Electronically Signed   By: Ulyses Jarred M.D.   On: 05/31/2022 00:47    Pending Labs Unresulted Labs (From admission, onward)     Start     Ordered   06/01/22 0500  CBC with Differential/Platelet  Tomorrow morning,   R        05/31/22 1106   06/01/22 [id number]  Basic metabolic panel  Tomorrow morning,   R        05/31/22 1106   05/31/22 0901  Lupus anticoagulant panel  (Hypercoagulable Panel, Comprehensive (PNL))  Once,   R        05/31/22 0901   05/31/22 0901  Beta-2-glycoprotein i abs, IgG/M/A  (Hypercoagulable Panel, Comprehensive (PNL))  Once,   R        05/31/22 0901   05/31/22 0901  Homocysteine, serum  (Hypercoagulable Panel, Comprehensive (PNL))  Once,   R        05/31/22 0901   05/31/22 0901  Cardiolipin antibodies, IgG, IgM, IgA  (Hypercoagulable Panel, Comprehensive (PNL))  Once,   R        05/31/22 0901            Vitals/Pain Today's Vitals   05/31/22 1030 05/31/22 1100 05/31/22 1116 05/31/22 1116  BP: (!) 134/98 (!) 141/89    Pulse: 66 69    Resp: (!) 22 (!) 22    Temp:      TempSrc:      SpO2: 98% 98% 98%   Weight:      Height:      PainSc:    0-No pain    Isolation Precautions No active isolations  Medications Medications   stroke: early stages of recovery book (has no administration in time range)  0.9 %  sodium chloride infusion ( Intravenous New Bag/Given 05/31/22 0757)  acetaminophen (TYLENOL) tablet 650 mg (has no administration in time range)    Or  acetaminophen (TYLENOL) 160 MG/5ML solution 650 mg (has no administration in time range)    Or  acetaminophen (TYLENOL) suppository 650 mg (has no administration in time range)  senna-docusate (Senokot-S) tablet 1 tablet (has no administration in time range)  enoxaparin (LOVENOX) injection 40 mg (40 mg Subcutaneous Given 05/30/22 2322)  aspirin EC tablet 81 mg (81 mg Oral Given 05/31/22 0916)  amLODipine (NORVASC) tablet 5 mg (5 mg Oral Given 05/31/22 0916)  carvedilol (COREG) tablet 12.5 mg (12.5 mg  Oral Given 05/31/22 0758)  lisinopril (ZESTRIL) tablet 20 mg (20 mg Oral Given 05/31/22 0916)  rosuvastatin (CRESTOR) tablet 20 mg (20 mg Oral Given 05/30/22 2321)  clopidogrel (PLAVIX) tablet 75 mg (75 mg Oral Given 05/31/22 0916)  iohexol (OMNIPAQUE) 350 MG/ML injection 75 mL (75 mLs Intravenous Contrast Given 05/31/22 0004)  Mobility walks Moderate fall risk   Focused Assessments Neuro Assessment Handoff:  Swallow screen pass? Yes    NIH Stroke Scale ( + Modified Stroke Scale Criteria)  Interval: Shift assessment Level of Consciousness (1a.)   : Alert, keenly responsive LOC Questions (1b. )   +: Answers both questions correctly LOC Commands (1c. )   + : Performs both tasks correctly Best Gaze (2. )  +: Normal Visual (3. )  +: No visual loss Facial Palsy (4. )    : Minor paralysis Motor Arm, Left (5a. )   +: No drift Motor Arm, Right (5b. )   +: No drift Motor Leg, Left (6a. )   +: Drift Motor Leg, Right (6b. )   +: No drift Limb Ataxia (7. ): Absent Sensory (8. )   +: Normal, no sensory loss Best Language (9. )   +: Mild-to-moderate aphasia Dysarthria (10. ): Normal Extinction/Inattention (11.)   +: No Abnormality Modified SS Total  +: 2 Complete NIHSS TOTAL: 3     Neuro Assessment: Exceptions to WDL Neuro Checks:   Initial (05/30/22 2142)  Last Documented NIHSS Modified Score: 2 (05/31/22 1115) Has TPA been given? No If patient is a Neuro Trauma and patient is going to OR before floor call report to [place] nurse: [phone] or [phone]   R Recommendations: See Admitting Provider Note  Report given to:   Additional Notes:

## 2022-05-31 NOTE — Evaluation (Signed)
Occupational Therapy Evaluation/Discharge Patient Details Name: Austin Moreno MRN: [id number] DOB: [birth year] Today's Date: 05/31/2022   History of Present Illness Pt is a 51 y/o male who presented with R facial droop and impaired speech. MRI brain showed  acute infarct involving the posterior limb of the internal capsule on the left. Pending TEE 11/29. PMH: HTN, HLD, prediabetes, hepatic cirrhosis.   Clinical Impression   PTA, pt lives with spouse and typically Independent in all daily tasks. Pt presents now at baseline for ADLs/mobility, able to mobilize around unit without assistance. BUE strength 5/5 and sensation intact. Pt and wife report the only deficit remaining is slurred speech though able to communicate with interpreter easily. No further skilled OT services needed at acute level or on DC. Pt functionally appropriate for DC home when medically cleared.      Recommendations for follow up therapy are one component of a multi-disciplinary discharge planning process, led by the attending physician.  Recommendations may be updated based on patient status, additional functional criteria and insurance authorization.   Follow Up Recommendations  No OT follow up     Assistance Recommended at Discharge None  Patient can return home with the following      Functional Status Assessment  Patient has not had a recent decline in their functional status  Equipment Recommendations  None recommended by OT    Recommendations for Other Services       Precautions / Restrictions Precautions Precautions: None Restrictions Weight Bearing Restrictions: No      Mobility Bed Mobility Overal bed mobility: Modified Independent                  Transfers Overall transfer level: Independent Equipment used: None                      Balance Overall balance assessment: Independent                                         ADL either performed or  assessed with clinical judgement   ADL Overall ADL's : Independent                                       General ADL Comments: able to manage socks easily, ambulate around unit without AD and without LOB. Strength 5/5 UB and pt denies any concerns other than speech     Vision Baseline Vision/History: 0 No visual deficits Ability to See in Adequate Light: 0 Adequate Patient Visual Report: No change from baseline Vision Assessment?: No apparent visual deficits     Perception     Praxis      Pertinent Vitals/Pain Pain Assessment Pain Assessment: No/denies pain     Hand Dominance Right   Extremity/Trunk Assessment Upper Extremity Assessment Upper Extremity Assessment: Overall WFL for tasks assessed   Lower Extremity Assessment Lower Extremity Assessment: Overall WFL for tasks assessed   Cervical / Trunk Assessment Cervical / Trunk Assessment: Normal   Communication Communication Communication: Prefers language other than [place];Interpreter utilized;Other (comment) (reports continued slurred speech at times)   Cognition Arousal/Alertness: Awake/alert Behavior During Therapy: WFL for tasks assessed/performed Overall Cognitive Status: Within Functional Limits for tasks assessed  General Comments  wife at bedside    Exercises     Shoulder Instructions      Home Living Family/patient expects to be discharged to:: Private residence Living Arrangements: Spouse/significant other Available Help at Discharge: Family;Available 24 hours/day Type of Home: House Home Access: Stairs to enter [employer] of Steps: 2   Home Layout: One level     Bathroom Shower/Tub: Chief Strategy Officer: Standard     Home Equipment: None          Prior Functioning/Environment Prior Level of Function : Independent/Modified Independent               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        OT Problem List:        OT Treatment/Interventions:      OT Goals(Current goals can be found in the care plan section) Acute Rehab OT Goals Patient Stated Goal: for speech to normalize OT Goal Formulation: All assessment and education complete, DC therapy  OT Frequency:      Co-evaluation              AM-PAC OT "6 Clicks" Daily Activity     Outcome Measure Help from another person eating meals?: None Help from another person taking care of personal grooming?: None Help from another person toileting, which includes using toliet, bedpan, or urinal?: None Help from another person bathing (including washing, rinsing, drying)?: None Help from another person to put on and taking off regular upper body clothing?: None Help from another person to put on and taking off regular lower body clothing?: None 6 Click Score: 24   End of Session Nurse Communication: Mobility status  Activity Tolerance: Patient tolerated treatment well Patient left: in bed;with call bell/phone within reach;with family/visitor present  OT Visit Diagnosis: Cognitive communication deficit (R41.841) Symptoms and signs involving cognitive functions: Cerebral infarction                Time: 1203-1221 OT Time Calculation (min): 18 min Charges:  OT General Charges $OT Visit: 1 Visit OT Evaluation $OT Eval Low Complexity: 1 Low  Bradd Canary, OTR/L Acute Rehab Services Office: [phone]   Lorre Munroe 05/31/2022, 12:30 PM

## 2022-05-31 NOTE — Progress Notes (Signed)
Pt admitted on unit. Oriented to unit bed locked with call bell in reach. Assisted pt in ordering lunch.

## 2022-05-31 NOTE — ED Provider Notes (Signed)
[facility] EMERGENCY DEPARTMENT Provider Note  CSN: [id number] Arrival date & time: 05/30/22 1522  Chief Complaint(s) Facial Droop and Aphasia  HPI Austin Moreno is a 51 y.o. male with PMH HTN, HLD, obesity, cirrhosis, recent hospital admission for hypertensive urgency/emergency who presents emergency department for evaluation of facial droop, aphasia and extremity weakness.  History obtained from patient's daughter states that symptoms began on Saturday 05/28/2022 and to have progressed where family feels like he is leaning to the right and does not have enough strength in his right leg.  Denies chest pain, shortness of breath, abdominal pain, nausea, vomiting or other systemic symptoms.   Past Medical History Past Medical History:  Diagnosis Date   Allergy    Has a nurse friend who gives him a shot every year for his allergies--sounds like corticosteroids   Hyperlipidemia    With good HDL   Hypertension    Obesity (BMI 30-39.9) 2016   Prediabetes    Patient Active Problem List   Diagnosis Date Noted   Acute CVA (cerebrovascular accident) ([place]) 05/30/2022   Hypertensive emergency [id number]   Acute diastolic CHF (congestive heart failure) ([place]) 05/19/2022   Cirrhosis of liver ([place]) 05/19/2022   Hypokalemia 05/19/2022   Hypertensive urgency 05/18/2022   Chest pain 05/18/2022   Elevated troponin [id number]   Alcoholic cirrhosis of liver with ascites ([place]) 05/18/2022   Obesity 08/16/2016   Prediabetes 12/03/2015   Hypertension    Hyperlipidemia    Allergy    Home Medication(s) Prior to Admission medications   Medication Sig Start Date End Date Taking? Authorizing Provider  acetaminophen (TYLENOL) 500 MG tablet Take 1,000-2,000 mg by mouth in the morning and at bedtime.   Yes [provider]  amLODipine (NORVASC) 5 MG tablet Take 1 tablet (5 mg total) by mouth daily. 05/21/22  Yes Rai, Ripudeep K, MD  aspirin EC 81 MG tablet Take 1 tablet  (81 mg total) by mouth daily. Swallow whole. 05/21/22  Yes Rai, Ripudeep K, MD  carvedilol (COREG) 12.5 MG tablet Take 1 tablet (12.5 mg total) by mouth 2 (two) times daily with a meal. 05/21/22  Yes Rai, Ripudeep K, MD  lisinopril (ZESTRIL) 20 MG tablet Take 1 tablet (20 mg total) by mouth daily. 05/21/22  Yes Rai, Ripudeep K, MD  rosuvastatin (CRESTOR) 20 MG tablet Take 1 tablet (20 mg total) by mouth at bedtime. 05/21/22  Yes Rai, Vernelle Emerald, MD                                                                                                                                    Past Surgical History History reviewed. No pertinent surgical history. Family History Family History  Problem Relation Age of Onset   Heart disease Mother        CAD   Heart disease Brother 62       one brother with heart valve problems  Social History Social History   Tobacco Use   Smoking status: Light Smoker    Years: 32.00    Types: Cigarettes    Last attempt to quit: 07/05/2015    Years since quitting: 6.9   Smokeless tobacco: Never  Substance Use Topics   Alcohol use: Yes    Comment: History of alcohol abuse.  Stopped 07/2014.Still going to [employer].   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  Review of Systems Review of Systems  Neurological:  Positive for speech difficulty, weakness and numbness.    Physical Exam Vital Signs  I have reviewed the triage vital signs BP (!) 140/93   Pulse 70   Temp 98.1 F (36.7 C) (Oral)   Resp 18   Ht 5\' 6"  (1.676 m)   Wt 86.6 kg   SpO2 99%   BMI 30.83 kg/m   Physical Exam Constitutional:      General: He is not in acute distress.    Appearance: Normal appearance.  HENT:     Head: Normocephalic and atraumatic.     Nose: No congestion or rhinorrhea.  Eyes:     General:        Right eye: No discharge.        Left eye: No discharge.     Extraocular Movements: Extraocular  movements intact.     Pupils: Pupils are equal, round, and reactive to light.  Cardiovascular:     Rate and Rhythm: Normal rate and regular rhythm.     Heart sounds: No murmur heard. Pulmonary:     Effort: No respiratory distress.     Breath sounds: No wheezing or rales.  Abdominal:     General: There is no distension.     Tenderness: There is no abdominal tenderness.  Musculoskeletal:        General: Normal range of motion.     Cervical back: Normal range of motion.  Skin:    General: Skin is warm and dry.  Neurological:     General: No focal deficit present.     Mental Status: He is alert.     Cranial Nerves: Cranial nerve deficit present.     Motor: Weakness present.     ED Results and Treatments Labs (all labs ordered are listed, but only abnormal results are displayed) Labs Reviewed  CBC - Abnormal; Notable for the following components:      Result Value   WBC 11.0 (*)    All other components within normal limits  DIFFERENTIAL - Abnormal; Notable for the following components:   Basophils Absolute 0.2 (*)    All other components within normal limits  COMPREHENSIVE METABOLIC PANEL - Abnormal; Notable for the following components:   Glucose, Bld 186 (*)    BUN 28 (*)    Creatinine, Ser 1.32 (*)    Albumin 3.1 (*)    All other components within normal limits  BASIC METABOLIC PANEL - Abnormal; Notable for the following components:   Glucose, Bld 117 (*)    BUN 24 (*)    Calcium 8.6 (*)    All other components within normal limits  CBC - Abnormal; Notable for the following components:   WBC 11.7 (*)    All other components within normal limits  CBG MONITORING, ED - Abnormal; Notable for the following components:   Glucose-Capillary 200 (*)    All other components within normal limits  I-STAT CHEM 8, ED - Abnormal; Notable for the following components:   BUN 30 (*)    Creatinine, Ser 1.40 (*)    Glucose, Bld 188 (*)    All other components within normal limits   ETHANOL  RAPID URINE DRUG SCREEN, HOSP PERFORMED  URINALYSIS, ROUTINE W REFLEX MICROSCOPIC  LIPID PANEL  LUPUS ANTICOAGULANT PANEL  BETA-2-GLYCOPROTEIN I ABS, IGG/M/A  HOMOCYSTEINE  CARDIOLIPIN ANTIBODIES, IGG, IGM, IGA                                                                                                                          Radiology MR BRAIN WO CONTRAST  Addendum Date: 05/31/2022   ADDENDUM REPORT: 05/31/2022 08:58 ADDENDUM: On further review there are additional sites diffusion restriction in the anterior right temporal lobe and the left thalamus. 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 Electronically Signed   By: Marin Roberts M.D.   On: 05/31/2022 08:58   Result Date: 05/31/2022 CLINICAL DATA:  Stroke follow-up EXAM: MRI HEAD WITHOUT CONTRAST TECHNIQUE: Multiplanar, multiecho pulse sequences of the brain and surrounding structures were obtained without intravenous contrast. 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 Sequela of mild to moderate chronic microvascular ischemic change. T2/FLAIR hyperintense signal is also seen involving the genu of the corpus callosum. Advanced generalized volume loss. Vascular: Normal flow voids. Skull and upper cervical spine: Normal marrow signal. Small disc bulge at C3-C4 that likely results in at least mild spinal canal narrowing. Sinuses/Orbits: Trace left mastoid effusion. Bilateral lens replacement. Other: None. IMPRESSION: 1. 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 No hemorrhage. 2. 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 Electronically Signed: By: Marin Roberts M.D. On: 05/30/2022 17:46   CT ANGIO HEAD NECK W WO CM  Result Date: 05/31/2022 CLINICAL DATA:  Right facial droop EXAM: CT ANGIOGRAPHY HEAD AND NECK TECHNIQUE: Multidetector CT imaging of the head and neck was performed using the standard protocol during bolus administration of intravenous contrast. Multiplanar CT image reconstructions and MIPs were obtained to evaluate the vascular anatomy. Carotid stenosis measurements (when applicable) are obtained utilizing NASCET criteria, using the distal internal carotid diameter as the denominator. RADIATION DOSE REDUCTION: This exam was performed according to the departmental dose-optimization program which includes automated exposure control, adjustment of the mA and/or kV according to patient size and/or use of iterative reconstruction technique. CONTRAST:  89mL OMNIPAQUE IOHEXOL 350 MG/ML SOLN COMPARISON:  05/30/2022 brain MRI FINDINGS: CT HEAD FINDINGS Brain: There is no mass, hemorrhage or extra-axial collection. The size and configuration of the ventricles and extra-axial CSF spaces are normal. Subacute infarction of the  left internal capsule. Multifocal parenchymal calcification. Skull: The visualized skull base, calvarium and extracranial soft tissues are normal. Sinuses/Orbits: No fluid levels or advanced mucosal thickening of the visualized paranasal sinuses. No mastoid or middle ear effusion. The orbits are normal. CTA NECK FINDINGS SKELETON: There is no bony spinal canal stenosis. No lytic or blastic lesion. OTHER NECK: Normal pharynx, larynx and major salivary glands. No cervical lymphadenopathy. Unremarkable thyroid gland. UPPER CHEST: No pneumothorax or pleural effusion. No nodules or masses. AORTIC ARCH: There is calcific atherosclerosis of the aortic arch. There is no aneurysm, dissection or hemodynamically significant stenosis of  the visualized portion of the aorta. Conventional 3 vessel aortic branching pattern. The visualized proximal subclavian arteries are widely patent. RIGHT CAROTID SYSTEM: No dissection, occlusion or aneurysm. There is mixed density atherosclerosis extending into the proximal ICA, resulting in less than 50% stenosis. There is irregular noncalcified plaque along the wall of the distal ICA to the level of the skull base LEFT CAROTID SYSTEM: No dissection, occlusion or aneurysm. There is mixed density atherosclerosis extending into the proximal ICA, resulting in 50% stenosis. VERTEBRAL ARTERIES: Left dominant configuration. Both origins are clearly patent. There is no dissection, occlusion or flow-limiting stenosis to the skull base (V1-V3 segments). CTA HEAD FINDINGS POSTERIOR CIRCULATION: --Vertebral arteries: Normal V4 segments. --Inferior cerebellar arteries: Normal. --Basilar artery: Normal. --Superior cerebellar arteries: Normal. --Posterior cerebral arteries (PCA): Normal. ANTERIOR CIRCULATION: --Intracranial internal carotid arteries: Normal. --Anterior cerebral arteries (ACA): Atherosclerotic irregularity of both anterior cerebral arteries --Middle cerebral arteries (MCA): Normal. VENOUS SINUSES: As permitted by contrast timing, patent. ANATOMIC VARIANTS: None Review of the MIP images confirms the above findings. IMPRESSION: 1. No emergent large vessel occlusion or high-grade stenosis of the intracranial arteries. 2. 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 3. 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 Irregular noncalcified plaque of the distal right internal carotid artery to the level of the skull base, which could indicate a source of embolic disease. Electronically Signed   By: Ulyses Jarred M.D.   On: 05/31/2022 00:47    Pertinent labs & imaging results that were available during my care of the patient were reviewed by me and considered in my  medical decision making (see MDM for details).  Medications Ordered in ED Medications   stroke: early stages of recovery book (has no administration in time range)  0.9 %  sodium chloride infusion ( Intravenous New Bag/Given 05/31/22 0757)  acetaminophen (TYLENOL) tablet 650 mg (has no administration in time range)    Or  acetaminophen (TYLENOL) 160 MG/5ML solution 650 mg (has no administration in time range)    Or  acetaminophen (TYLENOL) suppository 650 mg (has no administration in time range)  senna-docusate (Senokot-S) tablet 1 tablet (has no administration in time range)  enoxaparin (LOVENOX) injection 40 mg (40 mg Subcutaneous Given 05/30/22 2322)  aspirin EC tablet 81 mg (81 mg Oral Given 05/31/22 0916)  amLODipine (NORVASC) tablet 5 mg (5 mg Oral Given 05/31/22 0916)  carvedilol (COREG) tablet 12.5 mg (12.5 mg Oral Given 05/31/22 0758)  lisinopril (ZESTRIL) tablet 20 mg (20 mg Oral Given 05/31/22 0916)  rosuvastatin (CRESTOR) tablet 20 mg (20 mg Oral Given 05/30/22 2321)  clopidogrel (PLAVIX) tablet 75 mg (75 mg Oral Given 05/31/22 0916)  iohexol (OMNIPAQUE) 350 MG/ML injection 75 mL (75 mLs Intravenous Contrast Given 05/31/22 0004)  Procedures .Critical Care  Performed by: Teressa Lower, MD Authorized by: Teressa Lower, MD   Critical care provider statement:    Critical care time (minutes):  30   Critical care was necessary to treat or prevent imminent or life-threatening deterioration of the following conditions:  CNS failure or compromise   Critical care was time spent personally by me on the following activities:  Development of treatment plan with patient or surrogate, discussions with consultants, evaluation of patient's response to treatment, examination of patient, ordering and review of laboratory studies, ordering and review of  radiographic studies, ordering and performing treatments and interventions, pulse oximetry, re-evaluation of patient's condition and review of old charts   (including critical care time)  Medical Decision Making / ED Course   This patient presents to the ED for concern of aphasia, numbness, weakness, this involves an extensive number of treatment options, and is a complaint that carries with it a high risk of complications and morbidity.  The differential diagnosis includes CVA, TIA, brain mass, electrolyte abnormality, hypoglycemia, encephalopathy  MDM: Patient seen emergency room for evaluation of multiple complaints described above.  Physical exam with a right-sided facial droop, right upper and right lower extremity weakness.  Patient is outside the stroke window and thus stroke alert called.  Laboratory evaluation with a leukocytosis to 11.0, BUN 28, creatinine 1.32.  MRI concerning for acute stroke involving the left internal capsule.  I consulted neurology and spoke with Dr. Cheral Marker who recommends medical admission for completion of stroke workup.  Patient admitted.   Additional history obtained: -Additional history obtained from multiple family members -External records from outside source obtained and reviewed including: Chart review including previous notes, labs, imaging, consultation notes   Lab Tests: -I ordered, reviewed, and interpreted labs.   The pertinent results include:   Labs Reviewed  CBC - Abnormal; Notable for the following components:      Result Value   WBC 11.0 (*)    All other components within normal limits  DIFFERENTIAL - Abnormal; Notable for the following components:   Basophils Absolute 0.2 (*)    All other components within normal limits  COMPREHENSIVE METABOLIC PANEL - Abnormal; Notable for the following components:   Glucose, Bld 186 (*)    BUN 28 (*)    Creatinine, Ser 1.32 (*)    Albumin 3.1 (*)    All other components within normal limits   BASIC METABOLIC PANEL - Abnormal; Notable for the following components:   Glucose, Bld 117 (*)    BUN 24 (*)    Calcium 8.6 (*)    All other components within normal limits  CBC - Abnormal; Notable for the following components:   WBC 11.7 (*)    All other components within normal limits  CBG MONITORING, ED - Abnormal; Notable for the following components:   Glucose-Capillary 200 (*)    All other components within normal limits  I-STAT CHEM 8, ED - Abnormal; Notable for the following components:   BUN 30 (*)    Creatinine, Ser 1.40 (*)    Glucose, Bld 188 (*)    All other components within normal limits  ETHANOL  RAPID URINE DRUG SCREEN, HOSP PERFORMED  URINALYSIS, ROUTINE W REFLEX MICROSCOPIC  LIPID PANEL  LUPUS ANTICOAGULANT PANEL  BETA-2-GLYCOPROTEIN I ABS, IGG/M/A  HOMOCYSTEINE  CARDIOLIPIN ANTIBODIES, IGG, IGM, IGA      EKG   EKG Interpretation  Date/Time:  Tuesday May 31 2022 09:23:27 EST Ventricular Rate:  67 PR Interval:  168 QRS Duration: 96 QT Interval:  397 QTC Calculation: 420 R Axis:   69 Text Interpretation: Sinus rhythm Abnormal T, consider ischemia, lateral leads Confirmed by Zaiah Credeur ([id number]) on 05/31/2022 10:28:24 AM         Imaging Studies ordered: I ordered imaging studies including MRI brain I independently visualized and interpreted imaging. I agree with the radiologist interpretation   Medicines ordered and prescription drug management: Meds ordered this encounter  Medications    stroke: early stages of recovery book   0.9 %  sodium chloride infusion   OR Linked Order Group    acetaminophen (TYLENOL) tablet 650 mg    acetaminophen (TYLENOL) 160 MG/5ML solution 650 mg    acetaminophen (TYLENOL) suppository 650 mg   senna-docusate (Senokot-S) tablet 1 tablet   enoxaparin (LOVENOX) injection 40 mg   aspirin EC tablet 81 mg    Swallow whole.     amLODipine (NORVASC) tablet 5 mg   carvedilol (COREG) tablet 12.5 mg    lisinopril (ZESTRIL) tablet 20 mg   rosuvastatin (CRESTOR) tablet 20 mg   clopidogrel (PLAVIX) tablet 75 mg   iohexol (OMNIPAQUE) 350 MG/ML injection 75 mL    -I have reviewed the patients home medicines and have made adjustments as needed  Critical interventions Neurology consultation, MRI  Consultations Obtained: I requested consultation with the neurologist Dr. Otelia Limes,  and discussed lab and imaging findings as well as pertinent plan - they recommend: Medical admission for completion of stroke workup   Cardiac Monitoring: The patient was maintained on a cardiac monitor.  I personally viewed and interpreted the cardiac monitored which showed an underlying rhythm of: NSR  Social Determinants of Health:  Factors impacting patients care include: Spanish-speaking   Reevaluation: After the interventions noted above, I reevaluated the patient and found that they have :stayed the same  Co morbidities that complicate the patient evaluation  Past Medical History:  Diagnosis Date   Allergy    Has a nurse friend who gives him a shot every year for his allergies--sounds like corticosteroids   Hyperlipidemia    With good HDL   Hypertension    Obesity (BMI 30-39.9) 2016   Prediabetes       Dispostion: I considered admission for this patient, and due to the new acute stroke, patient will require hospital admission     Final Clinical Impression(s) / ED Diagnoses Final diagnoses:  Cerebrovascular accident (CVA), unspecified mechanism ([facility])     @PCDICTATION @    , MD 05/31/22 1029

## 2022-05-31 NOTE — Progress Notes (Signed)
PT Cancellation Note  Patient Details Name: Austin Moreno MRN: [id number] DOB: September 09, 1970   Cancelled Treatment:    Reason Eval/Treat Not Completed: PT screened, no needs identified, will sign off - per OT, pt independent with mobility and at baseline for strength, balance. PT to sign off, please reconsult as needed.   Marye Round, PT DPT Acute Rehabilitation Services Pager [phone]  Office [phone]    [name] 05/31/2022, 1:26 PM

## 2022-05-31 NOTE — Telephone Encounter (Signed)
TOC HFU APPT  Name: Austin Moreno, Austin Moreno MRN: [id number]  Date: 06/06/2022 Status: Sch  Time: 3:15 PM Length: 60  Visit Type: NEW PATIENT 60 [244] Copay: $0.00  Provider: Morene Crocker, MD

## 2022-05-31 NOTE — Anesthesia Preprocedure Evaluation (Signed)
Anesthesia Evaluation  Patient identified by MRN, date of birth, ID band Patient awake    Reviewed: Allergy & Precautions, NPO status , Patient's Chart, lab work & pertinent test results  History of Anesthesia Complications Negative for: history of anesthetic complications  Airway Mallampati: III  TM Distance: >3 FB Neck ROM: Full    Dental  (+) Loose,  Reports all of his teeth are loose:   Pulmonary neg shortness of breath, neg sleep apnea, neg COPD, neg recent URI, Current Smoker   Pulmonary exam normal breath sounds clear to auscultation       Cardiovascular hypertension (carvedilol), Pt. on medications and Pt. on home beta blockers (-) angina (-) Past MI, (-) Cardiac Stents and (-) CABG + Valvular Problems/Murmurs (mild) MR  Rhythm:Regular Rate:Normal  HLD  TTE 05/19/2022: IMPRESSIONS     1. Left ventricular ejection fraction, by estimation, is 55 to 60%. The  left ventricle has normal function. The left ventricle has no regional  wall motion abnormalities. There is mild left ventricular hypertrophy.  Left ventricular diastolic parameters  are indeterminate.   2. Right ventricular systolic function is normal. The right ventricular  size is normal.   3. Mild mitral valve regurgitation. Moderate mitral annular  calcification.   4. The aortic valve is tricuspid. Aortic valve regurgitation is not  visualized.   5. The inferior vena cava is dilated in size with <50% respiratory  variability, suggesting right atrial pressure of 15 mmHg.     Neuro/Psych neg Seizures CVA (on Plavix)    GI/Hepatic negative GI ROS,,,(+) Cirrhosis  (alcoholic)  ascites      Endo/Other  neg diabetes  prediabetes  Renal/GU negative Renal ROS     Musculoskeletal   Abdominal  (+) + obese  Peds  Hematology negative hematology ROS (+)   Anesthesia Other Findings 51 y.o. male with history of hypertension, hyperlipidemia, prediabetes,  recent hypertensive emergency admission now admitted for right facial droop, slurred speech. No tPA given due to outside window.    Stroke:  left internal capsule, right temporal pole and left splenium 3 scattered infarcts, embolic vs. synchronized small vessel disease   Reproductive/Obstetrics                             Anesthesia Physical Anesthesia Plan  ASA: 4  Anesthesia Plan: MAC   Post-op Pain Management:    Induction: Intravenous  PONV Risk Score and Plan: Propofol infusion  Airway Management Planned: Natural Airway and Nasal Cannula  Additional Equipment:   Intra-op Plan:   Post-operative Plan:   Informed Consent: I have reviewed the patients History and Physical, chart, labs and discussed the procedure including the risks, benefits and alternatives for the proposed anesthesia with the patient or authorized representative who has indicated his/her understanding and acceptance.     Dental advisory given and Interpreter used for interveiw [employer] ID [phone])  Plan Discussed with: CRNA and Anesthesiologist  Anesthesia Plan Comments: (Discussed with patient risks of MAC including, but not limited to, minor pain or discomfort, hearing people in the room, and possible need for backup general anesthesia. Risks for general anesthesia also discussed including, but not limited to, sore throat, hoarse voice, chipped/damaged teeth, injury to vocal cords, nausea and vomiting, allergic reactions, lung infection, heart attack, stroke, and death. All questions answered. )        Anesthesia Quick Evaluation

## 2022-05-31 NOTE — Progress Notes (Signed)
PROGRESS NOTE    Austin Moreno  URK:[id number] DOB: Aug 17, 1970 DOA: 05/30/2022 PCP: Julieanne Manson, MD   Brief Narrative:  HPI: Austin Moreno is a 51 y.o. Spanish-speaking male with medical history significant for HTN, HLD, prediabetes, obesity, hepatic cirrhosis findings on CT who presented to the ED for evaluation of right facial droop and speech difficulty.   Patient recently admitted 05/18/2022-05/21/2022 for hypertensive emergency associated with elevated troponin and pulmonary edema. TTE 05/19/2022 showed EF 55-60%, LV diastolic parameters indeterminate.  Initially treated with IV labetalol, hydralazine, IV Lasix.  Discharged on Coreg 12.5 mg BID, lisinopril 20 mg daily, amlodipine 5 mg daily.  Also started on aspirin 81 mg.   Patient reports adherence to medications since recent discharge from the hospital.  He was LKW when he went to bed the evening of 11/24.  When he awoke on the morning of 11/25 he noticed new right facial droop with dysarthric speech and expressive aphasia.  He had difficulty walking and reported feeling pain at the top of his left foot but no numbness/tingling.  He denies weakness in his extremities.   He says he previously smoked 1-2 packs/week and drank 1-2 beers daily prior to his recent hospitalization but has not use any tobacco or alcohol since he was discharged.   ED Course  Labs/Imaging on admission: I have personally reviewed following labs and imaging studies.   Initial vitals showed BP 144/93, pulse 66, RR 24, temp 98.4 F, SpO2 99% on room air.   Labs show WBC 11.0, hemoglobin 13.8, platelets 308,000, sodium 137, potassium 4.3, bicarb 22, BUN 28, creatinine 1.32, serum glucose 186, LFTs within normal limits, serum ethanol <10.   MRI brain without contrast shows acute infarct involving the posterior limb of the internal capsule on the left.  No hemorrhage.  Redemonstration of scattered calcific foci in the bilateral cerebral  hemispheres noted, favored to represent sequela of a prior/chronic infectious or inflammatory process.   Neurology consulted and recommended medical admission for further CVA workup.  The hospitalist service was consulted to admit for further evaluation and management.  Assessment & Plan:   Principal Problem:   Acute CVA (cerebrovascular accident) [facility]) Active Problems:   Hypertension   Hyperlipidemia   Cirrhosis of liver (HCC)  Acute CVA (cerebrovascular accident) (HCC) Presenting with right facial droop, dysarthria, expressive aphasia.  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  CTA head and neck shows no emergent large vessel occlusion or high-grade stenosis.  Neurology following, he is on aspirin and Plavix.  -Recent TTE 05/19/2022 showed EF 55-60%.  Neurology recommending TEE and 30-day monitor, neurology is reaching out to cardiology as well.   Hypertension, essential: Out of the window for permissive hypertension.  Still has elevated diastolic blood pressure.  Continue home medications as below. -Amlodipine 5 mg daily -Coreg 12.5 mg twice daily -Lisinopril 20 mg daily   Cirrhosis of liver (HCC) Cirrhotic morphology noted on CT 05/18/2022.  No sign of hepatic encephalopathy or acute decompensation.   Hyperlipidemia Continue rosuvastatin.  AKI: Resolved.  DVT prophylaxis: enoxaparin (LOVENOX) injection 40 mg Start: 05/30/22 2245   Code Status: Full Code  Family Communication: Son and wife present at bedside.  Plan of care discussed with patient in length and he/she verbalized understanding and agreed with it.  Status is: Observation The patient will require care spanning > 2 midnights and should be moved to inpatient because: Patient is going to require TEE tomorrow.  Estimated body mass index is 30.83 kg/m as calculated from the following:   Height as of this encounter: 5\' 6"  (1.676 m).   Weight as of this  encounter: 86.6 kg.    Nutritional Assessment: Body mass index is 30.83 kg/m.Marland Kitchen Seen by dietician.  I agree with the assessment and plan as outlined below: Nutrition Status:        . Skin Assessment: I have examined the patient's skin and I agree with the wound assessment as performed by the wound care RN as outlined below:    Consultants:  Neurology  Procedures:  None  Antimicrobials:  Anti-infectives (From admission, onward)    None         Subjective: Patient seen and examined in the ED.  He still has some facial droop and some dysarthria, slightly improved compared to yesterday but not back to baseline.  He has no complaints.  Eating breakfast.  Objective: Vitals:   05/31/22 0900 05/31/22 0915 05/31/22 1000 05/31/22 1030  BP: (!) 140/93  (!) 143/91 (!) 134/98  Pulse: 70 70 64 66  Resp: (!) 27 18 (!) 22 (!) 22  Temp:      TempSrc:      SpO2: 97% 99% 100% 98%  Weight:      Height:       No intake or output data in the 24 hours ending 05/31/22 1059 Filed Weights   05/30/22 1536  Weight: 86.6 kg    Examination:  General exam: Appears calm and comfortable  Respiratory system: Clear to auscultation. Respiratory effort normal. Cardiovascular system: S1 & S2 heard, RRR. No JVD, murmurs, rubs, gallops or clicks. No pedal edema. Gastrointestinal system: Abdomen is nondistended, soft and nontender. No organomegaly or masses felt. Normal bowel sounds heard. Central nervous system: Alert and oriented.  Power equal in all extremities, has some facial droop and some dysarthria. Extremities: Symmetric 5 x 5 power. Skin: No rashes, lesions or ulcers  Data Reviewed: I have personally reviewed following labs and imaging studies  CBC: Recent Labs  Lab 05/30/22 1546 05/30/22 1618 05/31/22 0512  WBC 11.0*  --  11.7*  NEUTROABS 6.1  --   --   HGB 13.8 14.6 13.9  HCT 41.6 43.0 42.9  MCV 87.9  --  89.0  PLT 308  --  [id number]   Basic Metabolic Panel: Recent Labs   Lab 05/30/22 1546 05/30/22 1618 05/31/22 0512  NA 137 138 136  K 4.3 4.3 3.8  CL 107 105 107  CO2 22  --  24  GLUCOSE 186* 188* 117*  BUN 28* 30* 24*  CREATININE 1.32* 1.40* 1.22  CALCIUM 9.4  --  8.6*   GFR: Estimated Creatinine Clearance: 73 mL/min (by C-G formula based on SCr of 1.22 mg/dL). Liver Function Tests: Recent Labs  Lab 05/30/22 1546  AST 28  ALT 40  ALKPHOS 48  BILITOT 0.4  PROT 7.7  ALBUMIN 3.1*   No results for input(s): "LIPASE", "AMYLASE" in the last 168 hours. No results for input(s): "AMMONIA" in the last 168 hours. Coagulation Profile: No results for input(s): "INR", "PROTIME" in the last 168 hours. Cardiac Enzymes: No results for input(s): "CKTOTAL", "CKMB", "CKMBINDEX", "TROPONINI" in the last 168 hours. BNP (last 3 results) No results for input(s): "PROBNP" in the last 8760 hours. HbA1C: No results for input(s): "HGBA1C" in the last 72 hours. CBG: Recent Labs  Lab 05/30/22 1536  GLUCAP 200*   Lipid Profile: Recent Labs    05/31/22 [phone]  CHOL 133  HDL 44  LDLCALC NOT CALCULATED  TRIG 87  CHOLHDL 3.0   Thyroid Function Tests: No results for input(s): "TSH", "T4TOTAL", "FREET4", "T3FREE", "THYROIDAB" in the last 72 hours. Anemia Panel: No results for input(s): "VITAMINB12", "FOLATE", "FERRITIN", "TIBC", "IRON", "RETICCTPCT" in the last 72 hours. Sepsis Labs: No results for input(s): "PROCALCITON", "LATICACIDVEN" in the last 168 hours.  No results found for this or any previous visit (from the past 240 hour(s)).   Radiology Studies: MR BRAIN WO CONTRAST  Addendum Date: 05/31/2022   ADDENDUM REPORT: 05/31/2022 08:58 ADDENDUM: On further review there are additional sites diffusion restriction in the anterior right temporal lobe and the left thalamus. 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 Electronically Signed   By: Marin Roberts M.D.   On: 05/31/2022 08:58   Result Date: 05/31/2022 CLINICAL DATA:   Stroke follow-up EXAM: MRI HEAD WITHOUT CONTRAST TECHNIQUE: Multiplanar, multiecho pulse sequences of the brain and surrounding structures were obtained without intravenous contrast. 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 Sequela of mild to moderate chronic microvascular ischemic change. T2/FLAIR hyperintense signal is also seen involving the genu of the corpus callosum. Advanced generalized volume loss. Vascular: Normal flow voids. Skull and upper cervical spine: Normal marrow signal. Small disc bulge at C3-C4 that likely results in at least mild spinal canal narrowing. Sinuses/Orbits: Trace left mastoid effusion. Bilateral lens replacement. Other: None. IMPRESSION: 1. 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 No hemorrhage. 2. 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 Electronically Signed: By: Marin Roberts M.D. On: 05/30/2022 17:46   CT ANGIO HEAD NECK W WO CM  Result Date: 05/31/2022 CLINICAL DATA:  Right facial droop EXAM: CT ANGIOGRAPHY HEAD AND NECK TECHNIQUE: Multidetector CT imaging of the head and neck was performed using the standard protocol during bolus administration of intravenous contrast. Multiplanar CT image reconstructions and MIPs were obtained to evaluate the vascular anatomy. Carotid stenosis measurements (when applicable) are obtained utilizing NASCET criteria, using the distal internal carotid diameter as the denominator. RADIATION DOSE REDUCTION: This exam was performed according to the departmental  dose-optimization program which includes automated exposure control, adjustment of the mA and/or kV according to patient size and/or use of iterative reconstruction technique. CONTRAST:  41mL OMNIPAQUE IOHEXOL 350 MG/ML SOLN COMPARISON:  05/30/2022 brain MRI FINDINGS: CT HEAD FINDINGS Brain: There is no mass, hemorrhage or extra-axial collection. The size and configuration of the ventricles and extra-axial CSF spaces are normal. 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 Multifocal parenchymal calcification. Skull: The visualized skull base, calvarium and extracranial soft tissues are normal. Sinuses/Orbits: No fluid levels or advanced mucosal thickening of the visualized paranasal sinuses. No mastoid or middle ear effusion. The orbits are normal. CTA NECK FINDINGS SKELETON: There is no bony spinal canal stenosis. No lytic or blastic lesion. OTHER NECK: Normal pharynx, larynx and major salivary glands. No cervical lymphadenopathy. Unremarkable thyroid gland. UPPER CHEST: No pneumothorax or pleural effusion. No nodules or masses. AORTIC ARCH: There is calcific atherosclerosis of the aortic arch. There is no aneurysm, dissection or hemodynamically significant stenosis of the visualized portion of the aorta. Conventional 3 vessel aortic  branching pattern. The visualized proximal subclavian arteries are widely patent. RIGHT CAROTID SYSTEM: No dissection, occlusion or aneurysm. There is mixed density atherosclerosis extending into the proximal ICA, resulting in less than 50% stenosis. There is irregular noncalcified plaque along the wall of the distal ICA to the level of the skull base LEFT CAROTID SYSTEM: No dissection, occlusion or aneurysm. There is mixed density atherosclerosis extending into the proximal ICA, resulting in 50% stenosis. VERTEBRAL ARTERIES: Left dominant configuration. Both origins are clearly patent. There is no dissection, occlusion or flow-limiting stenosis to the skull base (V1-V3  segments). CTA HEAD FINDINGS POSTERIOR CIRCULATION: --Vertebral arteries: Normal V4 segments. --Inferior cerebellar arteries: Normal. --Basilar artery: Normal. --Superior cerebellar arteries: Normal. --Posterior cerebral arteries (PCA): Normal. ANTERIOR CIRCULATION: --Intracranial internal carotid arteries: Normal. --Anterior cerebral arteries (ACA): Atherosclerotic irregularity of both anterior cerebral arteries --Middle cerebral arteries (MCA): Normal. VENOUS SINUSES: As permitted by contrast timing, patent. ANATOMIC VARIANTS: None Review of the MIP images confirms the above findings. IMPRESSION: 1. No emergent large vessel occlusion or high-grade stenosis of the intracranial arteries. 2. 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 3. 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 Irregular noncalcified plaque of the distal right internal carotid artery to the level of the skull base, which could indicate a source of embolic disease. Electronically Signed   By: Ulyses Jarred M.D.   On: 05/31/2022 00:47    Scheduled Meds:   stroke: early stages of recovery book   Does not apply Once   amLODipine  5 mg Oral Daily   aspirin EC  81 mg Oral Daily   carvedilol  12.5 mg Oral BID WC   clopidogrel  75 mg Oral Daily   enoxaparin (LOVENOX) injection  40 mg Subcutaneous QHS   lisinopril  20 mg Oral Daily   rosuvastatin  20 mg Oral QHS   Continuous Infusions:   LOS: 0 days   Darliss Cheney, MD [facility]  05/31/2022, 10:59 AM   *Please note that this is a verbal dictation therefore any spelling or grammatical errors are due to the "[place] [employer]" system interpretation.  Please page via [place] and do not message via secure chat for urgent patient care matters. Secure chat can be used for non urgent patient care matters.  How to contact the [facility] Attending or Consulting provider [place] or covering provider during after hours [place], for this  patient?  Check the care team in [facility] and look for a) attending/consulting [facility] provider listed and b) the [facility] team listed. Page or secure chat [phone]. Log into [email or web address] and use [place]'s universal password to access. If you do not have the password, please contact the hospital operator. Locate the [facility] provider you are looking for under [facility] and page to a number that you can be directly reached. If you still have difficulty reaching the provider, please page the [facility] (Director on Call) for the Hospitalists listed on amion for assistance.

## 2022-05-31 NOTE — ED Notes (Signed)
Patient report received, assumed care of patient.

## 2022-05-31 NOTE — Progress Notes (Signed)
STROKE TEAM PROGRESS NOTE   SUBJECTIVE (INTERVAL HISTORY) His wife is at the bedside.  Overall his condition is gradually improving. He still has mild right facial droop and slight dysarthria and left hand dysmetria. MRI showed 3 scattered infarcts bilaterally. Recommended TEE.   OBJECTIVE Temp:  [97.5 F (36.4 C)-98.3 F (36.8 C)] 98.1 F (36.7 C) (11/28 1537) Pulse Rate:  [61-77] 69 (11/28 1537) Cardiac Rhythm: Normal sinus rhythm (11/28 1313) Resp:  [13-27] 17 (11/28 1537) BP: (123-167)/(80-128) 140/96 (11/28 1537) SpO2:  [94 %-100 %] 100 % (11/28 1537)  Recent Labs  Lab 05/30/22 1536  GLUCAP 200*   Recent Labs  Lab 05/30/22 1546 05/30/22 1618 05/31/22 0512  NA 137 138 136  K 4.3 4.3 3.8  CL 107 105 107  CO2 22  --  24  GLUCOSE 186* 188* 117*  BUN 28* 30* 24*  CREATININE 1.32* 1.40* 1.22  CALCIUM 9.4  --  8.6*   Recent Labs  Lab 05/30/22 1546  AST 28  ALT 40  ALKPHOS 48  BILITOT 0.4  PROT 7.7  ALBUMIN 3.1*   Recent Labs  Lab 05/30/22 1546 05/30/22 1618 05/31/22 0512  WBC 11.0*  --  11.7*  NEUTROABS 6.1  --   --   HGB 13.8 14.6 13.9  HCT 41.6 43.0 42.9  MCV 87.9  --  89.0  PLT 308  --  289   No results for input(s): "CKTOTAL", "CKMB", "CKMBINDEX", "TROPONINI" in the last 168 hours. No results for input(s): "LABPROT", "INR" in the last 72 hours. Recent Labs    05/30/22 2325  COLORURINE YELLOW  LABSPEC 1.020  PHURINE 6.0  GLUCOSEU NEGATIVE  HGBUR NEGATIVE  BILIRUBINUR NEGATIVE  KETONESUR NEGATIVE  PROTEINUR NEGATIVE  NITRITE NEGATIVE  LEUKOCYTESUR NEGATIVE       Component Value Date/Time   CHOL 133 05/31/2022 0512   CHOL 180 11/29/2016 0843   TRIG 87 05/31/2022 0512   HDL 44 05/31/2022 0512   HDL 47 11/29/2016 0843   CHOLHDL 3.0 05/31/2022 0512   VLDL 17 05/31/2022 0512   LDLCALC NOT CALCULATED 05/31/2022 0512   LDLCALC 110 (H) 11/29/2016 0843   Lab Results  Component Value Date   HGBA1C 6.4 (H) 05/19/2022      Component Value  Date/Time   LABOPIA NONE DETECTED 05/30/2022 2325   COCAINSCRNUR NONE DETECTED 05/30/2022 2325   LABBENZ NONE DETECTED 05/30/2022 2325   AMPHETMU NONE DETECTED 05/30/2022 2325   THCU NONE DETECTED 05/30/2022 2325   LABBARB NONE DETECTED 05/30/2022 2325    Recent Labs  Lab 05/30/22 1546  ETH <10    I have personally reviewed the radiological images below and agree with the radiology interpretations.  MR BRAIN WO CONTRAST  Addendum Date: 05/31/2022   ADDENDUM REPORT: 05/31/2022 08:58 ADDENDUM: On further review there are additional sites diffusion restriction in the anterior right temporal lobe and the left thalamus. 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 Electronically Signed   By: Marin Roberts M.D.   On: 05/31/2022 08:58   Result Date: 05/31/2022 CLINICAL DATA:  Stroke follow-up EXAM: MRI HEAD WITHOUT CONTRAST TECHNIQUE: Multiplanar, multiecho pulse sequences of the brain and surrounding structures were obtained without intravenous contrast. 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 Sequela of mild to  moderate chronic microvascular ischemic change. T2/FLAIR hyperintense signal is also seen involving the genu of the corpus callosum. Advanced generalized volume loss. Vascular: Normal flow voids. Skull and upper cervical spine: Normal marrow signal. Small disc bulge at C3-C4 that likely results in at least mild spinal canal narrowing. Sinuses/Orbits: Trace left mastoid effusion. Bilateral lens replacement. Other: None. IMPRESSION: 1. 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 No hemorrhage. 2. 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 Electronically Signed: By: Marin Roberts M.D. On: 05/30/2022 17:46   CT ANGIO HEAD NECK W WO CM  Result Date: 05/31/2022 CLINICAL DATA:  Right facial droop EXAM: CT ANGIOGRAPHY HEAD AND NECK TECHNIQUE: Multidetector CT imaging of the head and neck was performed using the standard protocol during bolus administration of intravenous contrast. Multiplanar CT image reconstructions and MIPs were obtained to evaluate the vascular anatomy. Carotid stenosis measurements (when applicable) are obtained utilizing NASCET criteria, using the distal internal carotid diameter as the denominator. RADIATION DOSE REDUCTION: This exam was performed according to the departmental dose-optimization program which includes automated exposure control, adjustment of the mA and/or kV according to patient size and/or use of iterative reconstruction technique. CONTRAST:  80mL OMNIPAQUE IOHEXOL 350 MG/ML SOLN COMPARISON:  05/30/2022 brain MRI FINDINGS: CT HEAD FINDINGS Brain: There is no mass, hemorrhage or extra-axial collection. The size and configuration of the ventricles and extra-axial CSF spaces are normal. 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 Multifocal parenchymal calcification. Skull: The visualized skull base, calvarium and extracranial soft tissues are normal. Sinuses/Orbits: No fluid levels or advanced mucosal thickening of the visualized paranasal sinuses. No mastoid or middle ear effusion. The orbits are normal. CTA NECK FINDINGS SKELETON: There is no bony spinal canal stenosis. No lytic or blastic lesion. OTHER NECK: Normal pharynx, larynx and major salivary glands. No cervical lymphadenopathy. Unremarkable thyroid gland. UPPER CHEST: No pneumothorax or pleural effusion. No nodules or masses. AORTIC ARCH: There is calcific atherosclerosis of the aortic arch. There is no aneurysm,  dissection or hemodynamically significant stenosis of the visualized portion of the aorta. Conventional 3 vessel aortic branching pattern. The visualized proximal subclavian arteries are widely patent. RIGHT CAROTID SYSTEM: No dissection, occlusion or aneurysm. There is mixed density atherosclerosis extending into the proximal ICA, resulting in less than 50% stenosis. There is irregular noncalcified plaque along the wall of the distal ICA to the level of the skull base LEFT CAROTID SYSTEM: No dissection, occlusion or aneurysm. There is mixed density atherosclerosis extending into the proximal ICA, resulting in 50% stenosis. VERTEBRAL ARTERIES: Left dominant configuration. Both origins are clearly patent. There is no dissection, occlusion or flow-limiting stenosis to the skull base (V1-V3 segments). CTA HEAD FINDINGS POSTERIOR CIRCULATION: --Vertebral arteries: Normal V4 segments. --Inferior cerebellar arteries: Normal. --Basilar artery: Normal. --Superior cerebellar arteries: Normal. --Posterior cerebral arteries (PCA): Normal. ANTERIOR CIRCULATION: --Intracranial internal carotid arteries: Normal. --Anterior cerebral arteries (ACA): Atherosclerotic irregularity of both anterior cerebral arteries --Middle cerebral arteries (MCA): Normal. VENOUS SINUSES: As permitted by contrast timing, patent. ANATOMIC VARIANTS: None Review of the MIP images confirms the above findings. IMPRESSION: 1. No emergent large vessel occlusion or high-grade stenosis of the intracranial arteries. 2. 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 3. 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 Irregular noncalcified plaque of the distal right internal carotid artery to the level of the skull  base, which could indicate a source of embolic disease. Electronically Signed   By: Ulyses Jarred M.D.   On: 05/31/2022 00:47   ECHOCARDIOGRAM COMPLETE  Result Date: 05/19/2022    ECHOCARDIOGRAM  REPORT   Patient Name:   JONUEL KNAUTH Date of Exam: 05/19/2022 Medical Rec #:  [id number]           Height:       62.0 in Accession #:    [id number]          Weight:       206.0 lb Date of Birth:  October 12, 1970           BSA:          1.936 m Patient Age:    48 years            BP:           142/89 mmHg Patient Gender: M                   HR:           79 bpm. Exam Location:  Inpatient Procedure: 2D Echo, Cardiac Doppler, Color Doppler and Intracardiac            Opacification Agent Indications:    Chest pain  History:        Patient has no prior history of Echocardiogram examinations.                 Risk Factors:Hypertension and Dyslipidemia.  Sonographer:    Clayton Lefort RDCS (AE) Referring Phys: [id number] BELAL A SULEIMAN  Sonographer Comments: Suboptimal apical window. IMPRESSIONS  1. Left ventricular ejection fraction, by estimation, is 55 to 60%. The left ventricle has normal function. The left ventricle has no regional wall motion abnormalities. There is mild left ventricular hypertrophy. Left ventricular diastolic parameters are indeterminate.  2. Right ventricular systolic function is normal. The right ventricular size is normal.  3. Mild mitral valve regurgitation. Moderate mitral annular calcification.  4. The aortic valve is tricuspid. Aortic valve regurgitation is not visualized.  5. The inferior vena cava is dilated in size with <50% respiratory variability, suggesting right atrial pressure of 15 mmHg. FINDINGS  Left Ventricle: Left ventricular ejection fraction, by estimation, is 55 to 60%. The left ventricle has normal function. The left ventricle has no regional wall motion abnormalities. Definity contrast agent was given IV to delineate the left ventricular  endocardial borders. The left ventricular internal cavity size was normal in size. There is mild left ventricular hypertrophy. Left ventricular diastolic parameters are indeterminate. Right Ventricle: The right ventricular size is normal. Right  vetricular wall thickness was not assessed. Right ventricular systolic function is normal. Left Atrium: Left atrial size was normal in size. Right Atrium: Right atrial size was normal in size. Pericardium: There is no evidence of pericardial effusion. Mitral Valve: There is mild thickening of the mitral valve leaflet(s). Moderate mitral annular calcification. Mild mitral valve regurgitation. MV peak gradient, 12.8 mmHg. The mean mitral valve gradient is 4.0 mmHg. Tricuspid Valve: The tricuspid valve is normal in structure. Tricuspid valve regurgitation is trivial. Aortic Valve: The aortic valve is tricuspid. Aortic valve regurgitation is not visualized. Aortic valve mean gradient measures 3.0 mmHg. Aortic valve peak gradient measures 6.6 mmHg. Aortic valve area, by VTI measures 2.62 cm. Pulmonic Valve: The pulmonic valve was grossly normal. Pulmonic valve regurgitation is not visualized. No evidence of pulmonic stenosis. Aorta: The aortic root is normal in size and structure.  Venous: The inferior vena cava is dilated in size with less than 50% respiratory variability, suggesting right atrial pressure of 15 mmHg. IAS/Shunts: No atrial level shunt detected by color flow Doppler.  LEFT VENTRICLE PLAX 2D LVIDd:         4.50 cm   Diastology LVIDs:         3.10 cm   LV e' medial:    4.13 cm/s LV PW:         1.30 cm   LV E/e' medial:  42.1 LV IVS:        1.50 cm   LV e' lateral:   8.27 cm/s LVOT diam:     2.10 cm   LV E/e' lateral: 21.0 LV SV:         59 LV SV Index:   30 LVOT Area:     3.46 cm  RIGHT VENTRICLE             IVC RV Basal diam:  3.30 cm     IVC diam: 2.60 cm RV S prime:     13.30 cm/s TAPSE (M-mode): 1.2 cm LEFT ATRIUM             Index        RIGHT ATRIUM           Index LA diam:        4.30 cm 2.22 cm/m   RA Area:     19.50 cm LA Vol (A2C):   75.7 ml 39.10 ml/m  RA Volume:   54.00 ml  27.89 ml/m LA Vol (A4C):   74.6 ml 38.54 ml/m LA Biplane Vol: 79.5 ml 41.07 ml/m  AORTIC VALVE AV Area (Vmax):     2.58 cm AV Area (Vmean):   2.60 cm AV Area (VTI):     2.62 cm AV Vmax:           128.00 cm/s AV Vmean:          82.600 cm/s AV VTI:            0.223 m AV Peak Grad:      6.6 mmHg AV Mean Grad:      3.0 mmHg LVOT Vmax:         95.50 cm/s LVOT Vmean:        61.900 cm/s LVOT VTI:          0.169 m LVOT/AV VTI ratio: 0.76  AORTA Ao Root diam: 3.20 cm MITRAL VALVE MV Area (PHT): 3.79 cm     SHUNTS MV Area VTI:   1.61 cm     Systemic VTI:  0.17 m MV Peak grad:  12.8 mmHg    Systemic Diam: 2.10 cm MV Mean grad:  4.0 mmHg MV Vmax:       1.79 m/s MV Vmean:      92.3 cm/s MV Decel Time: 200 msec MV E velocity: 174.00 cm/s MV A velocity: 53.10 cm/s MV E/A ratio:  3.28 Dorris Carnes MD Electronically signed by Dorris Carnes MD Signature Date/Time: 05/19/2022/12:40:27 PM    Final    CT Angio Chest/Abd/Pel for Dissection W and/or W/WO  Result Date: 05/18/2022 CLINICAL DATA:  Head and stomach pain since yesterday, hypertension EXAM: CT ANGIOGRAPHY CHEST, ABDOMEN AND PELVIS TECHNIQUE: Non-contrast CT of the chest was initially obtained. Multidetector CT imaging through the chest, abdomen and pelvis was performed using the standard protocol during bolus administration of intravenous contrast. Multiplanar reconstructed images and MIPs were obtained and reviewed to evaluate the  vascular anatomy. RADIATION DOSE REDUCTION: This exam was performed according to the departmental dose-optimization program which includes automated exposure control, adjustment of the mA and/or kV according to patient size and/or use of iterative reconstruction technique. CONTRAST:  50mL OMNIPAQUE IOHEXOL 350 MG/ML SOLN COMPARISON:  08/29/2018 FINDINGS: CTA CHEST FINDINGS Cardiovascular: No evidence of thoracic aortic aneurysm or dissection. Mild cardiomegaly without pericardial effusion. Atherosclerosis of the coronary vasculature. Mediastinum/Nodes: No enlarged mediastinal, hilar, or axillary lymph nodes. Thyroid gland, trachea, and esophagus demonstrate  no significant findings. Lungs/Pleura: Trace bilateral pleural effusions, right greater than left. No airspace disease or pneumothorax. Central airways are patent. Musculoskeletal: There are no acute or destructive bony lesions. Reconstructed images demonstrate no additional findings. Review of the MIP images confirms the above findings. CTA ABDOMEN AND PELVIS FINDINGS VASCULAR Aorta: Normal caliber aorta without aneurysm, dissection, vasculitis or significant stenosis. Atherosclerosis of the distal aorta. Celiac: Patent without evidence of aneurysm, dissection, vasculitis or significant stenosis. SMA: Patent without evidence of aneurysm, dissection, vasculitis or significant stenosis. Renals: Both renal arteries are patent without evidence of aneurysm, dissection, vasculitis, fibromuscular dysplasia or significant stenosis. IMA: Patent without evidence of aneurysm, dissection, vasculitis or significant stenosis. Inflow: Patent without evidence of aneurysm, dissection, vasculitis or significant stenosis. Veins: No obvious venous abnormality within the limitations of this arterial phase study. Review of the MIP images confirms the above findings. NON-VASCULAR Hepatobiliary: Nodularity of the liver capsule and hypertrophy of the left lobe liver consistent with cirrhosis. No focal parenchymal liver abnormality. Gallbladder is unremarkable. Pancreas: Unremarkable. No pancreatic ductal dilatation or surrounding inflammatory changes. Spleen: Normal in size without focal abnormality. Adrenals/Urinary Tract: Adrenal glands are unremarkable. Kidneys are normal, without renal calculi, focal lesion, or hydronephrosis. Bladder is unremarkable. Stomach/Bowel: No bowel obstruction or ileus. Normal appendix right lower quadrant. No bowel wall thickening or inflammatory change. Lymphatic: No pathologic adenopathy. Reproductive: Prostate is unremarkable. Other: No free fluid or free intraperitoneal gas. No abdominal wall hernia.  Musculoskeletal: No acute or destructive bony lesions. Reconstructed images demonstrate no additional findings. Review of the MIP images confirms the above findings. IMPRESSION: 1. No evidence of thoracoabdominal aortic aneurysm or dissection. 2. Trace bilateral pleural effusions, right greater than left. 3. Nodular liver capsule and left lobe hypertrophy compatible with cirrhosis. 4.  Aortic Atherosclerosis (ICD10-I70.0). Electronically Signed   By: Randa Ngo M.D.   On: 05/18/2022 20:39   CT Head Wo Contrast  Result Date: 05/18/2022 CLINICAL DATA:  Sudden severe headaches EXAM: CT HEAD WITHOUT CONTRAST TECHNIQUE: Contiguous axial images were obtained from the base of the skull through the vertex without intravenous contrast. RADIATION DOSE REDUCTION: This exam was performed according to the departmental dose-optimization program which includes automated exposure control, adjustment of the mA and/or kV according to patient size and/or use of iterative reconstruction technique. COMPARISON:  10/01/2013 FINDINGS: Brain: No acute intracranial findings are seen. There are no signs of bleeding within the cranium. Ventricles are not dilated. There are multiple calcifications in both cerebral hemispheres with no significant interval change. There is no adjacent edema or mass effect. This finding may suggest presence of multiple healed granulomas or residual changes from some other inflammatory process such as cysticercosis. Vascular: Unremarkable. Skull: There is decreased in number of air cells in mastoids. No fracture is seen. Sinuses/Orbits: No acute findings are seen. Other: No significant interval changes are noted. IMPRESSION: No acute intracranial findings are seen in noncontrast CT brain. There are few scattered calcifications of varying sizes in both cerebral hemispheres with no  interval change since 10/01/2013 suggesting residual change from previous inflammation. Electronically Signed   By: Ernie Avena M.D.   On: 05/18/2022 12:23     PHYSICAL EXAM  Temp:  [97.5 F (36.4 C)-98.3 F (36.8 C)] 98.1 F (36.7 C) (11/28 1537) Pulse Rate:  [61-77] 69 (11/28 1537) Resp:  [13-27] 17 (11/28 1537) BP: (123-167)/(80-128) 140/96 (11/28 1537) SpO2:  [94 %-100 %] 100 % (11/28 1537)  General - Well nourished, well developed, in no apparent distress.  Ophthalmologic - fundi not visualized due to noncooperation.  Cardiovascular - Regular rhythm and rate.  Mental Status -  Level of arousal and orientation to time, place, and person were intact. Language including expression, naming, repetition, comprehension was assessed and found intact. Slight dysarthria Fund of Knowledge was assessed and was intact.  Cranial Nerves II - XII - II - Visual field intact OU. III, IV, VI - Extraocular movements intact. V - Facial sensation intact bilaterally. VII - mild right facial droop. VIII - Hearing & vestibular intact bilaterally. X - Palate elevates symmetrically. Slight dysarthria XI - Chin turning & shoulder shrug intact bilaterally. XII - Tongue protrusion intact.  Motor Strength - The patient's strength was normal in all extremities and pronator drift was absent.  Bulk was normal and fasciculations were absent.   Motor Tone - Muscle tone was assessed at the neck and appendages and was normal.  Reflexes - The patient's reflexes were symmetrical in all extremities and he had no pathological reflexes.  Sensory - Light touch, temperature/pinprick were assessed and were symmetrical.    Coordination - The patient had normal movements in the left hand and feet with no ataxia or dysmetria. Right FTN dysmetria. Tremor was absent.  Gait and Station - deferred.   ASSESSMENT/PLAN [name] is a 51 y.o. male with history of hypertension, hyperlipidemia, prediabetes, recent hypertensive emergency admission now admitted for right facial droop, slurred speech. No tPA given due to  outside window.    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 Patient counseled to be compliant with his antithrombotic medications Ongoing aggressive stroke risk factor management Therapy recommendations: None Disposition: Pending  Hypertension Recent admission for hypertensive emergency Stable on the high and Gradually normalized within 5-7 days. On amlodipine 5 and Coreg 12.5 Long term BP goal normotensive  Hyperlipidemia Home meds: Crestor 20 LDL 72, goal < 70 Now on Crestor 20 Continue statin at discharge  Other Stroke Risk Factors Obesity, Body mass index is 30.83 kg/m.   Other Active Problems AKI, creatinine 1.40-1.22 Leukocytosis WBC 11.0-11.7  Hospital day # 0    Marvel Plan, MD PhD Stroke Neurology 05/31/2022 4:14 PM    To contact Stroke Continuity provider, please refer to [email or web address]. After hours, contact General Neurology

## 2022-05-31 NOTE — Progress Notes (Signed)
Bilateral lower extremity venous duplex has been completed. Preliminary results can be found in CV Proc through chart review.   05/31/22 4:12 PM Olen Cordial RVT

## 2022-06-01 ENCOUNTER — Encounter (HOSPITAL_COMMUNITY): Payer: Self-pay | Admitting: Internal Medicine

## 2022-06-01 ENCOUNTER — Observation Stay (HOSPITAL_BASED_OUTPATIENT_CLINIC_OR_DEPARTMENT_OTHER): Payer: 59

## 2022-06-01 ENCOUNTER — Observation Stay (HOSPITAL_COMMUNITY): Payer: 59 | Admitting: Anesthesiology

## 2022-06-01 ENCOUNTER — Other Ambulatory Visit (HOSPITAL_COMMUNITY): Payer: Self-pay

## 2022-06-01 ENCOUNTER — Encounter (HOSPITAL_COMMUNITY)
Admission: EM | Disposition: A | Discharge status: Home / Self Care | Payer: Self-pay | Source: Home / Self Care | Attending: Student

## 2022-06-01 ENCOUNTER — Observation Stay (HOSPITAL_BASED_OUTPATIENT_CLINIC_OR_DEPARTMENT_OTHER): Payer: 59 | Admitting: Anesthesiology

## 2022-06-01 ENCOUNTER — Encounter: Payer: Self-pay | Admitting: *Deleted

## 2022-06-01 ENCOUNTER — Other Ambulatory Visit: Payer: Self-pay | Admitting: Cardiology

## 2022-06-01 DIAGNOSIS — E669 Obesity, unspecified: Secondary | ICD-10-CM | POA: Diagnosis not present

## 2022-06-01 DIAGNOSIS — Z6830 Body mass index (BMI) 30.0-30.9, adult: Secondary | ICD-10-CM | POA: Diagnosis not present

## 2022-06-01 DIAGNOSIS — I639 Cerebral infarction, unspecified: Secondary | ICD-10-CM

## 2022-06-01 DIAGNOSIS — I4891 Unspecified atrial fibrillation: Secondary | ICD-10-CM

## 2022-06-01 DIAGNOSIS — F17210 Nicotine dependence, cigarettes, uncomplicated: Secondary | ICD-10-CM | POA: Diagnosis not present

## 2022-06-01 DIAGNOSIS — I340 Nonrheumatic mitral (valve) insufficiency: Secondary | ICD-10-CM

## 2022-06-01 DIAGNOSIS — I083 Combined rheumatic disorders of mitral, aortic and tricuspid valves: Secondary | ICD-10-CM | POA: Diagnosis not present

## 2022-06-01 DIAGNOSIS — R69 Illness, unspecified: Secondary | ICD-10-CM | POA: Diagnosis not present

## 2022-06-01 DIAGNOSIS — I10 Essential (primary) hypertension: Secondary | ICD-10-CM

## 2022-06-01 HISTORY — PX: BUBBLE STUDY: SHX6837

## 2022-06-01 HISTORY — PX: TEE WITHOUT CARDIOVERSION: SHX5443

## 2022-06-01 LAB — CBC WITH DIFFERENTIAL/PLATELET
Abs Immature Granulocytes: 0.03 10*3/uL (ref 0.00–0.07)
Basophils Absolute: 0.2 10*3/uL — ABNORMAL HIGH (ref 0.0–0.1)
Basophils Relative: 2 %
Eosinophils Absolute: 0.3 10*3/uL (ref 0.0–0.5)
Eosinophils Relative: 3 %
HCT: 41.9 % (ref 39.0–52.0)
Hemoglobin: 14.1 g/dL (ref 13.0–17.0)
Immature Granulocytes: 0 %
Lymphocytes Relative: 32 %
Lymphs Abs: 3.2 10*3/uL (ref 0.7–4.0)
MCH: 29.2 pg (ref 26.0–34.0)
MCHC: 33.7 g/dL (ref 30.0–36.0)
MCV: 86.7 fL (ref 80.0–100.0)
Monocytes Absolute: 0.8 10*3/uL (ref 0.1–1.0)
Monocytes Relative: 8 %
Neutro Abs: 5.4 10*3/uL (ref 1.7–7.7)
Neutrophils Relative %: 55 %
Platelets: 279 10*3/uL (ref 150–400)
RBC: 4.83 MIL/uL (ref 4.22–5.81)
RDW: 13.1 % (ref 11.5–15.5)
WBC: 9.8 10*3/uL (ref 4.0–10.5)
nRBC: 0 % (ref 0.0–0.2)

## 2022-06-01 LAB — BETA-2-GLYCOPROTEIN I ABS, IGG/M/A
Beta-2 Glyco I IgG: <9 GPI IgG units (ref 0–20)
Beta-2-Glycoprotein I IgA: <9 GPI IgA units (ref 0–25)
Beta-2-Glycoprotein I IgM: <9 GPI IgM units (ref 0–32)

## 2022-06-01 LAB — BASIC METABOLIC PANEL
Anion gap: 7 (ref 5–15)
BUN: 17 mg/dL (ref 6–20)
CO2: 21 mmol/L — ABNORMAL LOW (ref 22–32)
Calcium: 8.5 mg/dL — ABNORMAL LOW (ref 8.9–10.3)
Chloride: 106 mmol/L (ref 98–111)
Creatinine, Ser: 1.19 mg/dL (ref 0.61–1.24)
GFR, Estimated: >60 mL/min (ref >60)
Glucose, Bld: 117 mg/dL — ABNORMAL HIGH (ref 70–99)
Potassium: 3.7 mmol/L (ref 3.5–5.1)
Sodium: 134 mmol/L — ABNORMAL LOW (ref 135–145)

## 2022-06-01 LAB — CARDIOLIPIN ANTIBODIES, IGG, IGM, IGA
Anticardiolipin IgA: <9 APL U/mL (ref 0–11)
Anticardiolipin IgG: <9 GPL U/mL (ref 0–14)
Anticardiolipin IgM: <9 MPL U/mL (ref 0–12)

## 2022-06-01 LAB — HOMOCYSTEINE: Homocysteine: 10.1 umol/L (ref 0.0–14.5)

## 2022-06-01 SURGERY — ECHOCARDIOGRAM, TRANSESOPHAGEAL
Anesthesia: Monitor Anesthesia Care

## 2022-06-01 MED ORDER — PROPOFOL 500 MG/50ML IV EMUL
INTRAVENOUS | Status: DC | PRN
  Administered 2022-06-01: 150 ug/kg/min via INTRAVENOUS

## 2022-06-01 MED ORDER — LIDOCAINE 2% (20 MG/ML) 5 ML SYRINGE
INTRAMUSCULAR | Status: DC | PRN
  Administered 2022-06-01: 60 mg via INTRAVENOUS

## 2022-06-01 MED ORDER — PROPOFOL 10 MG/ML IV BOLUS
INTRAVENOUS | Status: DC | PRN
  Administered 2022-06-01: 40 mg via INTRAVENOUS
  Administered 2022-06-01: 10 mg via INTRAVENOUS
  Administered 2022-06-01: 20 mg via INTRAVENOUS

## 2022-06-01 MED ORDER — LISINOPRIL 20 MG PO TABS
20.0000 mg | ORAL_TABLET | Freq: Every day | ORAL | 3 refills | Status: AC
  Filled 2022-06-01: qty 30, 30d supply, fill #0

## 2022-06-01 MED ORDER — CLOPIDOGREL BISULFATE 75 MG PO TABS
75.0000 mg | ORAL_TABLET | Freq: Every day | ORAL | 3 refills | Status: AC
  Filled 2022-06-01: qty 30, 30d supply, fill #0

## 2022-06-01 MED ORDER — AMLODIPINE BESYLATE 5 MG PO TABS
5.0000 mg | ORAL_TABLET | Freq: Every day | ORAL | 3 refills | Status: AC
  Filled 2022-06-01: qty 30, 30d supply, fill #0

## 2022-06-01 NOTE — TOC Transition Note (Addendum)
Transition of Care [facility]) - CM/SW Discharge Note   Patient Details  Name: Austin Moreno MRN: [id number] Date of Birth: 05/14/1971  [facility] CM/SW Contact:  Kermit Balo, RN Phone Number: 06/01/2022, 10:47 AM   Clinical Narrative:    Pt is discharging home with self care. No needs per TOC.  1251: ST recommending outpatient speech therapy. CM has sent referral to [facility]. Information on the AVS and provided to the patients spouse.   Final next level of care: Home/Self Care Barriers to Discharge: No Barriers Identified   Patient Goals and CMS Choice        Discharge Placement                       Discharge Plan and Services                                     Social Determinants of Health (SDOH) Interventions     Readmission Risk Interventions     No data to display

## 2022-06-01 NOTE — Interval H&P Note (Signed)
History and Physical Interval Note:  06/01/2022 8:52 AM  Austin Moreno  has presented today for surgery, with the diagnosis of STROKE.  The various methods of treatment have been discussed with the patient and family. After consideration of risks, benefits and other options for treatment, the patient has consented to  Procedure(s): TRANSESOPHAGEAL ECHOCARDIOGRAM (TEE) (N/A) as a surgical intervention.  The patient's history has been reviewed, patient examined, no change in status, stable for surgery.  I have reviewed the patient's chart and labs.  Questions were answered to the patient's satisfaction.     Olga Millers

## 2022-06-01 NOTE — Progress Notes (Signed)
     Transesophageal Echocardiogram Note  Rajat Staver [id number] 1970/09/15  Procedure: Transesophageal Echocardiogram Indications: CVA  Procedure Details Consent: Obtained Time Out: Verified patient identification, verified procedure, site/side was marked, verified correct patient position, special equipment/implants available, Radiology Safety Procedures followed,  medications/allergies/relevent history reviewed, required imaging and test results available.  Performed  Medications:  Pt sedated by anesthesia with lidocaine 60 mg and diprovan 265 mg IV total.  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   Complications: No apparent complications Patient did tolerate procedure well.  Olga Millers, MD

## 2022-06-01 NOTE — Transfer of Care (Signed)
Immediate Anesthesia Transfer of Care Note  Patient: Austin Moreno  Procedure(s) Performed: TRANSESOPHAGEAL ECHOCARDIOGRAM (TEE)  Patient Location: Endoscopy Unit  Anesthesia Type:MAC  Level of Consciousness: awake and drowsy  Airway & Oxygen Therapy: Patient Spontanous Breathing  Post-op Assessment: Report given to RN and Post -op Vital signs reviewed and stable  Post vital signs: Reviewed and stable  Last Vitals:  Vitals Value Taken Time  BP    Temp    Pulse 64 06/01/22 0920  Resp 20 06/01/22 0920  SpO2 92 % 06/01/22 0920  Vitals shown include unvalidated device data.  Last Pain:  Vitals:   06/01/22 0828  TempSrc: Temporal  PainSc: 0-No pain         Complications: No notable events documented.

## 2022-06-01 NOTE — Anesthesia Postprocedure Evaluation (Signed)
Anesthesia Post Note  Patient: Austin Moreno  Procedure(s) Performed: TRANSESOPHAGEAL ECHOCARDIOGRAM (TEE)     Patient location during evaluation: PACU Anesthesia Type: MAC Level of consciousness: awake Pain management: pain level controlled Vital Signs Assessment: post-procedure vital signs reviewed and stable Respiratory status: spontaneous breathing, nonlabored ventilation and respiratory function stable Cardiovascular status: blood pressure returned to baseline and stable Postop Assessment: no apparent nausea or vomiting Anesthetic complications: no   No notable events documented.  Last Vitals:  Vitals:   06/01/22 0940 06/01/22 0950  BP: 119/86   Pulse: 60 (!) 57  Resp: 16 (!) 21  Temp:    SpO2: 95% 97%    Last Pain:  Vitals:   06/01/22 0940  TempSrc:   PainSc: 0-No pain                 Nilda Simmer

## 2022-06-01 NOTE — Progress Notes (Signed)
  Echocardiogram 2D Echocardiogram has been performed.  Austin Moreno 06/01/2022, 9:46 AM

## 2022-06-01 NOTE — Anesthesia Procedure Notes (Signed)
Procedure Name: MAC Date/Time: 06/01/2022 9:00 AM  Performed by: Dorann Lodge, CRNAPre-anesthesia Checklist: Patient identified, Emergency Drugs available, Suction available and Patient being monitored Patient Re-evaluated:Patient Re-evaluated prior to induction Oxygen Delivery Method: Nasal cannula Airway Equipment and Method: Bite block Dental Injury: Teeth and Oropharynx as per pre-operative assessment

## 2022-06-01 NOTE — ED Provider Notes (Signed)
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

## 2022-06-01 NOTE — Progress Notes (Addendum)
STROKE TEAM PROGRESS NOTE   SUBJECTIVE (INTERVAL HISTORY) His wife is at the bedside.  Spanish interpreter used for the encounter.  Patient left facial droop has improved.  TTE showed no PFO or LV thrombus.  LE venous Doppler no DVT.  OBJECTIVE Temp:  [97.3 F (36.3 C)-98.7 F (37.1 C)] 97.9 F (36.6 C) (11/29 1210) Pulse Rate:  [57-70] 57 (11/29 1210) Cardiac Rhythm: Normal sinus rhythm (11/29 0800) Resp:  [16-25] 16 (11/29 1210) BP: (119-155)/(73-101) 137/91 (11/29 1210) SpO2:  [89 %-100 %] 96 % (11/29 1210)  Recent Labs  Lab 05/30/22 1536  GLUCAP 200*   Recent Labs  Lab 05/30/22 1546 05/30/22 1618 05/31/22 0512 06/01/22 0710  NA 137 138 136 134*  K 4.3 4.3 3.8 3.7  CL 107 105 107 106  CO2 22  --  24 21*  GLUCOSE 186* 188* 117* 117*  BUN 28* 30* 24* 17  CREATININE 1.32* 1.40* 1.22 1.19  CALCIUM 9.4  --  8.6* 8.5*   Recent Labs  Lab 05/30/22 1546  AST 28  ALT 40  ALKPHOS 48  BILITOT 0.4  PROT 7.7  ALBUMIN 3.1*   Recent Labs  Lab 05/30/22 1546 05/30/22 1618 05/31/22 0512 06/01/22 0710  WBC 11.0*  --  11.7* 9.8  NEUTROABS 6.1  --   --  5.4  HGB 13.8 14.6 13.9 14.1  HCT 41.6 43.0 42.9 41.9  MCV 87.9  --  89.0 86.7  PLT 308  --  289 279   No results for input(s): "CKTOTAL", "CKMB", "CKMBINDEX", "TROPONINI" in the last 168 hours. No results for input(s): "LABPROT", "INR" in the last 72 hours. Recent Labs    05/30/22 2325  COLORURINE YELLOW  LABSPEC 1.020  PHURINE 6.0  GLUCOSEU NEGATIVE  HGBUR NEGATIVE  BILIRUBINUR NEGATIVE  KETONESUR NEGATIVE  PROTEINUR NEGATIVE  NITRITE NEGATIVE  LEUKOCYTESUR NEGATIVE       Component Value Date/Time   CHOL 133 05/31/2022 0512   CHOL 180 11/29/2016 0843   TRIG 87 05/31/2022 0512   HDL 44 05/31/2022 0512   HDL 47 11/29/2016 0843   CHOLHDL 3.0 05/31/2022 0512   VLDL 17 05/31/2022 0512   LDLCALC NOT CALCULATED 05/31/2022 0512   LDLCALC 110 (H) 11/29/2016 0843   Lab Results  Component Value Date    HGBA1C 6.4 (H) 05/19/2022      Component Value Date/Time   LABOPIA NONE DETECTED 05/30/2022 2325   COCAINSCRNUR NONE DETECTED 05/30/2022 2325   LABBENZ NONE DETECTED 05/30/2022 2325   AMPHETMU NONE DETECTED 05/30/2022 2325   THCU NONE DETECTED 05/30/2022 2325   LABBARB NONE DETECTED 05/30/2022 2325    Recent Labs  Lab 05/30/22 1546  ETH <10    I have personally reviewed the radiological images below and agree with the radiology interpretations.  ECHO TEE  Result Date: 06/01/2022    TRANSESOPHOGEAL ECHO REPORT   Patient Name:   Austin Moreno Date of Exam: 06/01/2022 Medical Rec #:  [id number]           Height:       66.0 in Accession #:    [id number]          Weight:       191.0 lb Date of Birth:  1971-05-01           BSA:          1.961 m Patient Age:    51 years            BP:  148/94 mmHg Patient Gender: M                   HR:           73 bpm. Exam Location:  Inpatient Procedure: Transesophageal Echo, Cardiac Doppler and Color Doppler Indications:     Stroke  History:         Patient has prior history of Echocardiogram examinations, most                  recent 05/19/2022. Risk Factors:Hypertension and Dyslipidemia.  Sonographer:     Clayton Lefort RDCS (AE) Referring Phys:  [place] Diagnosing Phys: Kirk Ruths MD PROCEDURE: After discussion of the risks and benefits of a TEE, an informed consent was obtained from the patient. The transesophogeal probe was passed without difficulty through the esophogus of the patient. Sedation performed by different physician. The patient was monitored while under deep sedation. Anesthestetic sedation was provided intravenously by Anesthesiology: 264.85mg  of Propofol, 60mg  of Lidocaine. Image quality was good. The patient developed no complications during the procedure.  IMPRESSIONS  1. 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  2. Left  ventricular ejection fraction, by estimation, is 55 to 60%. The left ventricle has normal function. The left ventricle has no regional wall motion abnormalities.  3. Right ventricular systolic function is normal. The right ventricular size is normal.  4. No left atrial/left atrial appendage thrombus was detected.  5. The mitral valve is normal in structure. Mild mitral valve regurgitation.  6. The aortic valve is calcified. Aortic valve regurgitation is mild. No aortic stenosis is present.  7. There is mild (Grade II) plaque involving the descending aorta.  8. Agitated saline contrast bubble study was positive with shunting observed after >6 cardiac cycles suggestive of intrapulmonary shunting. FINDINGS  Left Ventricle: Left ventricular ejection fraction, by estimation, is 55 to 60%. The left ventricle has normal function. The left ventricle has no regional wall motion abnormalities. The left ventricular internal cavity size was normal in size. Right Ventricle: The right ventricular size is normal. Right ventricular systolic function is normal. Left Atrium: Left atrial size was normal in size. Spontaneous echo contrast was present in the left atrium. No left atrial/left atrial appendage thrombus was detected. Right Atrium: Right atrial size was normal in size. Pericardium: There is no evidence of pericardial effusion. Mitral Valve: The mitral valve is normal in structure. Mild mitral annular calcification. Mild mitral valve regurgitation. MV peak gradient, 11.0 mmHg. The mean mitral valve gradient is 3.0 mmHg. Tricuspid Valve: The tricuspid valve is normal in structure. Tricuspid valve regurgitation is mild. Aortic Valve: The aortic valve is calcified. Aortic valve regurgitation is mild. No aortic stenosis is present. Pulmonic Valve: The pulmonic valve was normal in structure. Pulmonic valve regurgitation is trivial. Aorta: The aortic root is normal in size and structure. There is mild (Grade II) plaque involving  the descending aorta. IAS/Shunts: No atrial level shunt detected by color flow Doppler. Agitated saline contrast bubble study was positive with shunting observed after >6 cardiac cycles suggestive of intrapulmonary shunting. 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  AORTA Ao Root diam: 2.90 cm Ao Asc diam:  3.60 cm MITRAL VALVE            TRICUSPID VALVE MV Peak grad: 11.0 mmHg TR Peak grad:  39.2 mmHg MV Mean grad: 3.0 mmHg  TR Vmax:        313.00 cm/s MV Vmax:      1.66 m/s MV Vmean:     74.2 cm/s Kirk Ruths MD Electronically signed by Kirk Ruths MD Signature Date/Time: 06/01/2022/12:44:23 PM    Final    VAS [place] LOWER EXTREMITY VENOUS (DVT)  Result Date: 05/31/2022  Lower Venous DVT Study Patient Name:  Austin Moreno  Date of Exam:   05/31/2022 Medical Rec #: [id number]            Accession #:    [id number] Date of Birth: 07/20/1970            Patient Gender: M Patient Age:   52 years Exam Location:  [facility] Procedure:      VAS [place] LOWER EXTREMITY VENOUS (DVT) Referring Phys: Cornelius Moras Paislynn Hegstrom --------------------------------------------------------------------------------  Indications: Stroke.  Risk Factors: None identified. Limitations: Poor ultrasound/tissue interface. Comparison Study: No prior studies. Performing Technologist: Oliver Hum RVT  Examination Guidelines: A complete evaluation includes B-mode imaging, spectral Doppler, color Doppler, and power Doppler as needed of all accessible portions of each vessel. Bilateral testing is considered an integral part of a complete examination. Limited examinations for reoccurring indications may be performed as noted. The reflux portion of the exam is performed with the patient in reverse Trendelenburg.  +---------+---------------+---------+-----------+----------+--------------+ RIGHT     CompressibilityPhasicitySpontaneityPropertiesThrombus Aging +---------+---------------+---------+-----------+----------+--------------+ CFV      Full           Yes      Yes                                 +---------+---------------+---------+-----------+----------+--------------+ SFJ      Full                                                        +---------+---------------+---------+-----------+----------+--------------+ FV Prox  Full                                                        +---------+---------------+---------+-----------+----------+--------------+ FV Mid   Full                                                        +---------+---------------+---------+-----------+----------+--------------+ FV DistalFull           Yes      Yes                                 +---------+---------------+---------+-----------+----------+--------------+ PFV      Full                                                        +---------+---------------+---------+-----------+----------+--------------+ POP  Full           Yes      Yes                                 +---------+---------------+---------+-----------+----------+--------------+ PTV      Full                                                        +---------+---------------+---------+-----------+----------+--------------+ PERO     Full                                                        +---------+---------------+---------+-----------+----------+--------------+   +---------+---------------+---------+-----------+----------+--------------+ LEFT     CompressibilityPhasicitySpontaneityPropertiesThrombus Aging +---------+---------------+---------+-----------+----------+--------------+ CFV      Full           Yes      Yes                                 +---------+---------------+---------+-----------+----------+--------------+ SFJ      Full                                                         +---------+---------------+---------+-----------+----------+--------------+ FV Prox  Full                                                        +---------+---------------+---------+-----------+----------+--------------+ FV Mid   Full                                                        +---------+---------------+---------+-----------+----------+--------------+ FV DistalFull                                                        +---------+---------------+---------+-----------+----------+--------------+ PFV      Full                                                        +---------+---------------+---------+-----------+----------+--------------+ POP      Full           Yes      Yes                                 +---------+---------------+---------+-----------+----------+--------------+  PTV      Full                                                        +---------+---------------+---------+-----------+----------+--------------+ PERO     Full                                                        +---------+---------------+---------+-----------+----------+--------------+     Summary: RIGHT: - There is no evidence of deep vein thrombosis in the lower extremity. However, portions of this examination were limited- see technologist comments above.  - No cystic structure found in the popliteal fossa.  LEFT: - There is no evidence of deep vein thrombosis in the lower extremity.  - No cystic structure found in the popliteal fossa.  *See table(s) above for measurements and observations. Electronically signed by Servando Snare MD on 05/31/2022 at 7:00:58 PM.    Final    MR BRAIN WO CONTRAST  Addendum Date: 05/31/2022   ADDENDUM REPORT: 05/31/2022 08:58 ADDENDUM: On further review there are additional sites diffusion restriction in the anterior right temporal lobe and the left thalamus. 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  Electronically Signed   By: Marin Roberts M.D.   On: 05/31/2022 08:58   Result Date: 05/31/2022 CLINICAL DATA:  Stroke follow-up EXAM: MRI HEAD WITHOUT CONTRAST TECHNIQUE: Multiplanar, multiecho pulse sequences of the brain and surrounding structures were obtained without intravenous contrast. 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 Sequela of mild to moderate chronic microvascular ischemic change. T2/FLAIR hyperintense signal is also seen involving the genu of the corpus callosum. Advanced generalized volume loss. Vascular: Normal flow voids. Skull and upper cervical spine: Normal marrow signal. Small disc bulge at C3-C4 that likely results in at least mild spinal canal narrowing. Sinuses/Orbits: Trace left mastoid effusion. Bilateral lens replacement. Other: None. IMPRESSION: 1. 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 No hemorrhage. 2. 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 Electronically Signed: By: Marin Roberts M.D. On: 05/30/2022 17:46   CT ANGIO HEAD NECK W WO CM  Result Date: 05/31/2022 CLINICAL DATA:  Right facial droop EXAM: CT ANGIOGRAPHY HEAD AND NECK TECHNIQUE: Multidetector CT imaging of the head and neck was performed using the standard protocol during bolus administration of intravenous contrast. Multiplanar CT image reconstructions and MIPs were obtained to evaluate the vascular anatomy. Carotid stenosis measurements (when applicable) are obtained utilizing NASCET criteria, using the distal internal carotid  diameter as the denominator. RADIATION DOSE REDUCTION: This exam was performed according to the departmental dose-optimization program which includes automated exposure control, adjustment of the mA and/or kV according to patient size and/or use of iterative reconstruction technique. CONTRAST:  13mL OMNIPAQUE IOHEXOL 350 MG/ML SOLN COMPARISON:  05/30/2022 brain MRI FINDINGS: CT HEAD FINDINGS Brain: There is no mass, hemorrhage  or extra-axial collection. The size and configuration of the ventricles and extra-axial CSF spaces are normal. 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 Multifocal parenchymal calcification. Skull: The visualized skull base, calvarium and extracranial soft tissues are normal. Sinuses/Orbits: No fluid levels or advanced mucosal thickening of the visualized paranasal sinuses. No mastoid or middle ear effusion. The orbits are normal. CTA NECK FINDINGS SKELETON: There is no bony spinal canal stenosis. No lytic or blastic lesion. OTHER NECK: Normal pharynx, larynx and major salivary glands. No cervical lymphadenopathy. Unremarkable thyroid gland. UPPER CHEST: No pneumothorax or pleural effusion. No nodules or masses. AORTIC ARCH: There is calcific atherosclerosis of the aortic arch. There is no aneurysm, dissection or hemodynamically significant stenosis of the visualized portion of the aorta. Conventional 3 vessel aortic branching pattern. The visualized proximal subclavian arteries are widely patent. RIGHT CAROTID SYSTEM: No dissection, occlusion or aneurysm. There is mixed density atherosclerosis extending into the proximal ICA, resulting in less than 50% stenosis. There is irregular noncalcified plaque along the wall of the distal ICA to the level of the skull base LEFT CAROTID SYSTEM: No dissection, occlusion or aneurysm. There is mixed density atherosclerosis extending into the proximal ICA, resulting in 50% stenosis. VERTEBRAL ARTERIES: Left dominant configuration. Both origins are  clearly patent. There is no dissection, occlusion or flow-limiting stenosis to the skull base (V1-V3 segments). CTA HEAD FINDINGS POSTERIOR CIRCULATION: --Vertebral arteries: Normal V4 segments. --Inferior cerebellar arteries: Normal. --Basilar artery: Normal. --Superior cerebellar arteries: Normal. --Posterior cerebral arteries (PCA): Normal. ANTERIOR CIRCULATION: --Intracranial internal carotid arteries: Normal. --Anterior cerebral arteries (ACA): Atherosclerotic irregularity of both anterior cerebral arteries --Middle cerebral arteries (MCA): Normal. VENOUS SINUSES: As permitted by contrast timing, patent. ANATOMIC VARIANTS: None Review of the MIP images confirms the above findings. IMPRESSION: 1. No emergent large vessel occlusion or high-grade stenosis of the intracranial arteries. 2. 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 3. 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 Irregular noncalcified plaque of the distal right internal carotid artery to the level of the skull base, which could indicate a source of embolic disease. Electronically Signed   By: Ulyses Jarred M.D.   On: 05/31/2022 00:47   ECHOCARDIOGRAM COMPLETE  Result Date: 05/19/2022    ECHOCARDIOGRAM REPORT   Patient Name:   Austin Moreno Date of Exam: 05/19/2022 Medical Rec #:  [id number]           Height:       62.0 in Accession #:    [id number]          Weight:       206.0 lb Date of Birth:  01-16-1971           BSA:          1.936 m Patient Age:    6 years            BP:           142/89 mmHg Patient Gender: M                   HR:           79 bpm. Exam Location:  Inpatient Procedure: 2D Echo, Cardiac Doppler, Color Doppler and Intracardiac            Opacification Agent Indications:    Chest pain  History:        Patient has no prior history of Echocardiogram examinations.  Risk Factors:Hypertension and Dyslipidemia.  Sonographer:    Clayton Lefort RDCS (AE) Referring  Phys: [id number] BELAL A SULEIMAN  Sonographer Comments: Suboptimal apical window. IMPRESSIONS  1. Left ventricular ejection fraction, by estimation, is 55 to 60%. The left ventricle has normal function. The left ventricle has no regional wall motion abnormalities. There is mild left ventricular hypertrophy. Left ventricular diastolic parameters are indeterminate.  2. Right ventricular systolic function is normal. The right ventricular size is normal.  3. Mild mitral valve regurgitation. Moderate mitral annular calcification.  4. The aortic valve is tricuspid. Aortic valve regurgitation is not visualized.  5. The inferior vena cava is dilated in size with <50% respiratory variability, suggesting right atrial pressure of 15 mmHg. FINDINGS  Left Ventricle: Left ventricular ejection fraction, by estimation, is 55 to 60%. The left ventricle has normal function. The left ventricle has no regional wall motion abnormalities. Definity contrast agent was given IV to delineate the left ventricular  endocardial borders. The left ventricular internal cavity size was normal in size. There is mild left ventricular hypertrophy. Left ventricular diastolic parameters are indeterminate. Right Ventricle: The right ventricular size is normal. Right vetricular wall thickness was not assessed. Right ventricular systolic function is normal. Left Atrium: Left atrial size was normal in size. Right Atrium: Right atrial size was normal in size. Pericardium: There is no evidence of pericardial effusion. Mitral Valve: There is mild thickening of the mitral valve leaflet(s). Moderate mitral annular calcification. Mild mitral valve regurgitation. MV peak gradient, 12.8 mmHg. The mean mitral valve gradient is 4.0 mmHg. Tricuspid Valve: The tricuspid valve is normal in structure. Tricuspid valve regurgitation is trivial. Aortic Valve: The aortic valve is tricuspid. Aortic valve regurgitation is not visualized. Aortic valve mean gradient measures 3.0  mmHg. Aortic valve peak gradient measures 6.6 mmHg. Aortic valve area, by VTI measures 2.62 cm. Pulmonic Valve: The pulmonic valve was grossly normal. Pulmonic valve regurgitation is not visualized. No evidence of pulmonic stenosis. Aorta: The aortic root is normal in size and structure. Venous: The inferior vena cava is dilated in size with less than 50% respiratory variability, suggesting right atrial pressure of 15 mmHg. IAS/Shunts: No atrial level shunt detected by color flow Doppler.  LEFT VENTRICLE PLAX 2D LVIDd:         4.50 cm   Diastology LVIDs:         3.10 cm   LV e' medial:    4.13 cm/s LV PW:         1.30 cm   LV E/e' medial:  42.1 LV IVS:        1.50 cm   LV e' lateral:   8.27 cm/s LVOT diam:     2.10 cm   LV E/e' lateral: 21.0 LV SV:         59 LV SV Index:   30 LVOT Area:     3.46 cm  RIGHT VENTRICLE             IVC RV Basal diam:  3.30 cm     IVC diam: 2.60 cm RV S prime:     13.30 cm/s TAPSE (M-mode): 1.2 cm LEFT ATRIUM             Index        RIGHT ATRIUM           Index LA diam:        4.30 cm 2.22 cm/m   RA Area:     19.50 cm LA Vol (  A2C):   75.7 ml 39.10 ml/m  RA Volume:   54.00 ml  27.89 ml/m LA Vol (A4C):   74.6 ml 38.54 ml/m LA Biplane Vol: 79.5 ml 41.07 ml/m  AORTIC VALVE AV Area (Vmax):    2.58 cm AV Area (Vmean):   2.60 cm AV Area (VTI):     2.62 cm AV Vmax:           128.00 cm/s AV Vmean:          82.600 cm/s AV VTI:            0.223 m AV Peak Grad:      6.6 mmHg AV Mean Grad:      3.0 mmHg LVOT Vmax:         95.50 cm/s LVOT Vmean:        61.900 cm/s LVOT VTI:          0.169 m LVOT/AV VTI ratio: 0.76  AORTA Ao Root diam: 3.20 cm MITRAL VALVE MV Area (PHT): 3.79 cm     SHUNTS MV Area VTI:   1.61 cm     Systemic VTI:  0.17 m MV Peak grad:  12.8 mmHg    Systemic Diam: 2.10 cm MV Mean grad:  4.0 mmHg MV Vmax:       1.79 m/s MV Vmean:      92.3 cm/s MV Decel Time: 200 msec MV E velocity: 174.00 cm/s MV A velocity: 53.10 cm/s MV E/A ratio:  3.28 Dietrich Pates MD Electronically  signed by Dietrich Pates MD Signature Date/Time: 05/19/2022/12:40:27 PM    Final    CT Angio Chest/Abd/Pel for Dissection W and/or W/WO  Result Date: 05/18/2022 CLINICAL DATA:  Head and stomach pain since yesterday, hypertension EXAM: CT ANGIOGRAPHY CHEST, ABDOMEN AND PELVIS TECHNIQUE: Non-contrast CT of the chest was initially obtained. Multidetector CT imaging through the chest, abdomen and pelvis was performed using the standard protocol during bolus administration of intravenous contrast. Multiplanar reconstructed images and MIPs were obtained and reviewed to evaluate the vascular anatomy. RADIATION DOSE REDUCTION: This exam was performed according to the departmental dose-optimization program which includes automated exposure control, adjustment of the mA and/or kV according to patient size and/or use of iterative reconstruction technique. CONTRAST:  97mL OMNIPAQUE IOHEXOL 350 MG/ML SOLN COMPARISON:  08/29/2018 FINDINGS: CTA CHEST FINDINGS Cardiovascular: No evidence of thoracic aortic aneurysm or dissection. Mild cardiomegaly without pericardial effusion. Atherosclerosis of the coronary vasculature. Mediastinum/Nodes: No enlarged mediastinal, hilar, or axillary lymph nodes. Thyroid gland, trachea, and esophagus demonstrate no significant findings. Lungs/Pleura: Trace bilateral pleural effusions, right greater than left. No airspace disease or pneumothorax. Central airways are patent. Musculoskeletal: There are no acute or destructive bony lesions. Reconstructed images demonstrate no additional findings. Review of the MIP images confirms the above findings. CTA ABDOMEN AND PELVIS FINDINGS VASCULAR Aorta: Normal caliber aorta without aneurysm, dissection, vasculitis or significant stenosis. Atherosclerosis of the distal aorta. Celiac: Patent without evidence of aneurysm, dissection, vasculitis or significant stenosis. SMA: Patent without evidence of aneurysm, dissection, vasculitis or significant stenosis.  Renals: Both renal arteries are patent without evidence of aneurysm, dissection, vasculitis, fibromuscular dysplasia or significant stenosis. IMA: Patent without evidence of aneurysm, dissection, vasculitis or significant stenosis. Inflow: Patent without evidence of aneurysm, dissection, vasculitis or significant stenosis. Veins: No obvious venous abnormality within the limitations of this arterial phase study. Review of the MIP images confirms the above findings. NON-VASCULAR Hepatobiliary: Nodularity of the liver capsule and hypertrophy of the left lobe liver consistent with cirrhosis.  No focal parenchymal liver abnormality. Gallbladder is unremarkable. Pancreas: Unremarkable. No pancreatic ductal dilatation or surrounding inflammatory changes. Spleen: Normal in size without focal abnormality. Adrenals/Urinary Tract: Adrenal glands are unremarkable. Kidneys are normal, without renal calculi, focal lesion, or hydronephrosis. Bladder is unremarkable. Stomach/Bowel: No bowel obstruction or ileus. Normal appendix right lower quadrant. No bowel wall thickening or inflammatory change. Lymphatic: No pathologic adenopathy. Reproductive: Prostate is unremarkable. Other: No free fluid or free intraperitoneal gas. No abdominal wall hernia. Musculoskeletal: No acute or destructive bony lesions. Reconstructed images demonstrate no additional findings. Review of the MIP images confirms the above findings. IMPRESSION: 1. No evidence of thoracoabdominal aortic aneurysm or dissection. 2. Trace bilateral pleural effusions, right greater than left. 3. Nodular liver capsule and left lobe hypertrophy compatible with cirrhosis. 4.  Aortic Atherosclerosis (ICD10-I70.0). Electronically Signed   By: Randa Ngo M.D.   On: 05/18/2022 20:39   CT Head Wo Contrast  Result Date: 05/18/2022 CLINICAL DATA:  Sudden severe headaches EXAM: CT HEAD WITHOUT CONTRAST TECHNIQUE: Contiguous axial images were obtained from the base of the skull  through the vertex without intravenous contrast. RADIATION DOSE REDUCTION: This exam was performed according to the departmental dose-optimization program which includes automated exposure control, adjustment of the mA and/or kV according to patient size and/or use of iterative reconstruction technique. COMPARISON:  10/01/2013 FINDINGS: Brain: No acute intracranial findings are seen. There are no signs of bleeding within the cranium. Ventricles are not dilated. There are multiple calcifications in both cerebral hemispheres with no significant interval change. There is no adjacent edema or mass effect. This finding may suggest presence of multiple healed granulomas or residual changes from some other inflammatory process such as cysticercosis. Vascular: Unremarkable. Skull: There is decreased in number of air cells in mastoids. No fracture is seen. Sinuses/Orbits: No acute findings are seen. Other: No significant interval changes are noted. IMPRESSION: No acute intracranial findings are seen in noncontrast CT brain. There are few scattered calcifications of varying sizes in both cerebral hemispheres with no interval change since 10/01/2013 suggesting residual change from previous inflammation. Electronically Signed   By: Elmer Picker M.D.   On: 05/18/2022 12:23     PHYSICAL EXAM  Temp:  [97.3 F (36.3 C)-98.7 F (37.1 C)] 97.9 F (36.6 C) (11/29 1210) Pulse Rate:  [57-70] 57 (11/29 1210) Resp:  [16-25] 16 (11/29 1210) BP: (119-155)/(73-101) 137/91 (11/29 1210) SpO2:  [89 %-100 %] 96 % (11/29 1210)  General - Well nourished, well developed, in no apparent distress.  Ophthalmologic - fundi not visualized due to noncooperation.  Cardiovascular - Regular rhythm and rate.  Mental Status -  Level of arousal and orientation to time, place, and person were intact. Language including expression, naming, repetition, comprehension was assessed and found intact. Slight dysarthria Fund of Knowledge  was assessed and was intact.  Cranial Nerves II - XII - II - Visual field intact OU. III, IV, VI - Extraocular movements intact. V - Facial sensation intact bilaterally. VII - mild right facial droop. VIII - Hearing & vestibular intact bilaterally. X - Palate elevates symmetrically. Slight dysarthria XI - Chin turning & shoulder shrug intact bilaterally. XII - Tongue protrusion intact.  Motor Strength - The patient's strength was normal in all extremities and pronator drift was absent.  Bulk was normal and fasciculations were absent.   Motor Tone - Muscle tone was assessed at the neck and appendages and was normal.  Reflexes - The patient's reflexes were symmetrical in all extremities and  he had no pathological reflexes.  Sensory - Light touch, temperature/pinprick were assessed and were symmetrical.    Coordination - The patient had normal movements in the left hand and feet with no ataxia or dysmetria. Right FTN dysmetria. Tremor was absent.  Gait and Station - deferred.   ASSESSMENT/PLAN [name] is a 51 y.o. male with history of hypertension, hyperlipidemia, prediabetes, recent hypertensive emergency admission now admitted for right facial droop, slurred speech. No tPA given due to outside window.    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 Patient counseled to be compliant with his antithrombotic medications Ongoing aggressive stroke risk factor management Therapy recommendations: None Disposition: Pending  Hypertension Recent admission for hypertensive emergency Stable on the high and Gradually normalized within 5-7 days. On amlodipine 5 and Coreg 12.5 Long term BP goal normotensive  Hyperlipidemia Home meds: Crestor 20 LDL 72, goal < 70 Now on Crestor 20 Continue statin at discharge  Other Stroke Risk Factors Obesity, Body mass index is 30.83 kg/m.   Other Active Problems AKI, creatinine 1.40-1.22-1.19 Leukocytosis WBC 11.0-11.7-9.8  Hospital day # 0  Neurology will sign off. Please call with questions. Pt will follow up with stroke clinic NP at [facility] in about 4 weeks. Thanks for the consult.   Rosalin Hawking, MD PhD Stroke Neurology 06/01/2022 7:23 PM    To contact Stroke Continuity provider, please refer to [email or web address] After hours, contact General Neurology

## 2022-06-01 NOTE — Evaluation (Signed)
Speech Language Pathology Evaluation Patient Details Name: Austin Moreno MRN: [id number] DOB: 03-18-1971 Today's Date: 06/01/2022 Time:  -     Problem List:  Patient Active Problem List   Diagnosis Date Noted   Acute CVA (cerebrovascular accident) (HCC) 05/30/2022   Hypertensive emergency 05/19/2022   Acute diastolic CHF (congestive heart failure) (HCC) 05/19/2022   Cirrhosis of liver (HCC) 05/19/2022   Hypokalemia 05/19/2022   Hypertensive urgency 05/18/2022   Chest pain 05/18/2022   Elevated troponin 05/18/2022   Alcoholic cirrhosis of liver with ascites (HCC) 05/18/2022   Obesity 08/16/2016   Prediabetes 12/03/2015   Hypertension    Hyperlipidemia    Allergy    Past Medical History:  Past Medical History:  Diagnosis Date   Allergy    Has a nurse friend who gives him a shot every year for his allergies--sounds like corticosteroids   Hyperlipidemia    With good HDL   Hypertension    Obesity (BMI 30-39.9) 2016   Prediabetes    Past Surgical History: History reviewed. No pertinent surgical history. HPI:  [name] is a 51 y.o. male with history of hypertension, hyperlipidemia, prediabetes, recent hypertensive emergency admission now admitted for right facial droop, slurred speech. 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 Pt presents with mild, but overt dysarthria; he is fully intelligible at conversation length. When asked to read a paragraph to further assess speech, pt was unable to discern unpredictable words though his glasses were on. Pts wife did report pt needed new readers, but pt confirmed that he was still able to use these glasses prior to admit and now he felt fluent reading was impossible. Pt then failed in two general visual  scanning tasks when searching for pictures/words in right lower quadrant. In a clock drawing task, planning and organization, awareness were all overtly impaired. Pt then administered the SLUMS in Spanish and scored 6/30 indicating significant cognitive impairment. Pt unable to demonstrate adequate working and short term memory function to recall elements of a story or repeat numbers. He could not identify shapes. Family present to observe. Advised pt and wife in basic safety precautions (medication, driving, and finances) and encouraged f/u with OP SLP as able. Pt encouraged to focus on cognition rehabilitation over dysarthria which is likely to spontanseously improve while cognition may need dedicated effort.    SLP Assessment  SLP Recommendation/Assessment: Patient needs continued Speech Lanaguage Pathology Services SLP Visit Diagnosis: Cognitive communication deficit (R41.841)    Recommendations for follow up therapy are one component of a multi-disciplinary discharge planning process, led by the attending physician.  Recommendations may be updated based on patient status, additional functional criteria and insurance authorization.    Follow Up Recommendations  Outpatient SLP    Assistance Recommended at Discharge     Functional Status Assessment    Frequency and Duration           SLP Evaluation Cognition  Overall Cognitive Status: Impaired/Different from baseline Arousal/Alertness: Awake/alert Orientation Level: Oriented X4 Attention: Focused;Sustained Focused Attention: Appears intact Sustained Attention: Appears intact Memory: Impaired Memory Impairment: Storage deficit;Retrieval deficit;Decreased short term memory Decreased Short Term Memory: Verbal basic;Functional basic Awareness: Impaired Awareness Impairment: Intellectual impairment;Emergent impairment Problem Solving: Impaired Problem Solving Impairment: Verbal basic;Functional basic Executive Function:  Organizing;Sequencing;Self Monitoring Sequencing: Impaired Sequencing Impairment: Functional basic Organizing:  Impaired Organizing Impairment: Functional basic Self Monitoring: Impaired Self Monitoring Impairment: Functional basic       Comprehension  Auditory Comprehension Overall Auditory Comprehension: Appears within functional limits for tasks assessed Reading Comprehension Reading Status: Impaired Word level: Impaired Sentence Level: Impaired Paragraph Level: Impaired Interfering Components: Visual scanning;Visual acuity    Expression Verbal Expression Overall Verbal Expression: Appears within functional limits for tasks assessed Written Expression Dominant Hand: Right Written Expression: Exceptions to [facility] Self Formulation Ability: Letter   Oral / Motor  Oral Motor/Sensory Function Overall Oral Motor/Sensory Function: Mild impairment Facial ROM: Reduced right Facial Symmetry: Abnormal symmetry right Facial Strength: Reduced right Lingual ROM: Within Functional Limits Lingual Symmetry: Within Functional Limits Lingual Strength: Reduced Motor Speech Overall Motor Speech: Impaired Respiration: Within functional limits Phonation: Normal Resonance: Within functional limits Articulation: Impaired Level of Impairment: Word Intelligibility: Intelligible Motor Planning: Witnin functional limits            Farra Nikolic, Katherene Ponto 06/01/2022, 2:22 PM

## 2022-06-01 NOTE — Progress Notes (Signed)
To endo at this time for TEE

## 2022-06-02 LAB — LUPUS ANTICOAGULANT PANEL
DRVVT: 36.8 s (ref 0.0–47.0)
PTT Lupus Anticoagulant: 41.8 s (ref 0.0–43.5)

## 2022-06-02 NOTE — Discharge Summary (Signed)
Physician Discharge Summary   Patient: Austin Moreno MRN: [id number] DOB: [birth year]  Admit date:     05/30/2022  Discharge date: 06/01/22  Discharge Physician: Edwin Dada   PCP: Mack Hook, MD    Recommendations at discharge:  Follow up with Dr. Amil Amen in 1 week for BP check Follow up with Neurology for new stroke in 6-8 weeks Cardiac monitor arranged, will follow up with Neurology     Discharge Diagnoses: Principal Problem:   Acute CVA (cerebrovascular accident) [facility]) Active Problems:   Hypertension   Hyperlipidemia   Obesity      Hospital Course: Mr. Givins is a 51 y.o. M with HTN, HLD, obesity who presented with new acute facial droop, dysarthria and aphasia.    Seen in ER 1 week ago due to high BP.    On admission, here MRI brain showed subacute infarct in posterior limb of internal capsule on left.    * Acute CVA (cerebrovascular accident) ([place]) MRI brain showed some old calcified foci in bilateral cerebral hemispheres, consistent with a history of neurocysticercosis but also new acute infarct in posterior limb of left IC, as well as restricted diffusion in anterior right temporal lobe and left thalamus, consistent with embolism.  Neurology consulted  Echo showed no cardiogenic source of embolism. Carotid imaging unremarkable.    Lipids ordered: discharged on Crestor  Aspirin ordered at admission --> discharged on DAPT for 3 weeks the Plavix indefinitely.  Atrial fibrillation: not noted on monitoring.  Cardiac monitor for 30 days will be sent to home and followed up by Cardiology  tPA not given because outside window.  Dysphagia screen ordered in ER.  PT eval ordered: recommended no follow up. Smoking cessation: not pertinent   Hypertension Resumed on home lisinopril, Coreg and amlodipine.  Pharmacy fill history implies he was only taking Coreg.  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     Hyperlipidemia  Obesity BMI 30.8           The [facility] Controlled Substances Registry was reviewed for this patient prior to discharge.   Consultants: Neurology   Disposition: Home Diet recommendation:  Discharge Diet Orders (From admission, onward)     Start     Ordered   06/01/22 0000  Diet - low sodium heart healthy        06/01/22 1246             DISCHARGE MEDICATION: Allergies as of 06/01/2022   No Known Allergies      Medication List     TAKE these medications    acetaminophen 500 MG tablet Commonly known as: TYLENOL Take 1,000-2,000 mg by mouth in the morning and at bedtime.   amLODipine 5 MG tablet Commonly known as: NORVASC Tome 1 tableta (5 mg en total) por va oral diariamente. (Take 1 tablet (5 mg total) by mouth daily.)   aspirin EC 81 MG tablet Take 1 tablet (81 mg total) by mouth daily. Swallow whole.   carvedilol 12.5 MG tablet Commonly known as: COREG Take 1 tablet (12.5 mg total) by mouth 2 (two) times daily with a meal.   clopidogrel 75 MG tablet Commonly known as: PLAVIX Tome 1 tableta (75 mg en total) por va oral diariamente. (Take 1 tablet (75 mg total) by mouth daily.)   lisinopril 20 MG tablet Commonly known as: ZESTRIL Tome 1 tableta (20 mg en total) por va oral diariamente. (Take 1 tablet (20 mg total) by mouth daily.)   rosuvastatin  20 MG tablet Commonly known as: CRESTOR Take 1 tablet (20 mg total) by mouth at bedtime.        Follow-up Information     [facility]. Schedule an appointment as soon as possible for a visit.   Specialty: Neurology Why: For stroke care in 4 weeks, stroke clinic Contact information: [address] [place] [phone]        Mack Hook, MD. Schedule an appointment as soon as possible for a visit in 1 week(s).   Specialty: Internal Medicine Contact information: [place]  [phone]         [place]. Schedule an appointment as soon as possible for a visit in 1 week(s).   Specialty: Rehabilitation Contact information: [address] [place] [id number] [place] [phone]                Discharge Instructions     Ambulatory referral to Neurology   Complete by: As directed    An appointment is requested in approximately: 8 weeks For stroke follow up   Ambulatory referral to Speech Therapy   Complete by: As directed    Diet - low sodium heart healthy   Complete by: As directed    Discharge instructions   Complete by: As directed    **IMPORTANT DISCHARGE INSTRUCTIONS**    From Dr. Loleta Books: You were admitted for a stroke  In the hospital, we can't treat a stroke with medicine to heal it.  We have to allow the body to heal itself. We try to promote this as best we can with physical therapy.  Thankfully, in your case, the stroke symptoms were so mild, we didn't even think that intensive therapy would be of much help. I will recommend to Dr. Amil Amen that she assess you in 1 week, and if you are having any troubles, refer you to the office of a physical therapist in the community  The other thing we try to do in the hospital is figure out what caused your stroke. That is the reason we did the imaging of your head and neck (which was normal), and the ultrasounds (both the ultrasound of your heart outside your chest, and the one today, where they put the ultrasound camera down your throat)  This second ultrasound showed nothing concerning  We recommend you continue your cholesterol medicine rosuvastatin.  We recommend you continue all of your blood pressure medicines (amlodipine, carvedilol, and lisinopril) without change.  Our pharmacy records indicate you haven't been picking up prescriptions for the amlodipine and lisinopril, and so I sent refills of  these to the pharmacy.  (If we were incorrect, and you were just using another pharmacy and you HAVE been taking it, that is good)  We recommend you go see Dr. Amil Amen in 1 week  Review your medicines with her and have her check your blood pressure  IMPORTANT: We want you to start the "super aspirin" called clopidogrel/Plavix.  For the next three weeks, take both aspirin 81 mg and clopidogrel/Plavix 75 mg once daily After three weeks, stop the aspirin and take clopidogrel alone from now on   Finally, we have sent a referral to the brain specialist.  Go see them in 8 weeks   Increase activity slowly   Complete by: As directed        Discharge Exam: Filed Weights   05/30/22 1536  Weight: 86.6 kg  General: Pt is alert, awake, not in acute distress Cardiovascular: RRR, nl S1-S2, no murmurs appreciated.   No LE edema.   Respiratory: Normal respiratory rate and rhythm.  CTAB without rales or wheezes. Abdominal: Abdomen soft and non-tender.  No distension or HSM.   Neuro/Psych: Strength symmetric in upper and lower extremities.  Judgment and insight appear normal.   Condition at discharge: good  The results of significant diagnostics from this hospitalization (including imaging, microbiology, ancillary and laboratory) are listed below for reference.   Imaging Studies: ECHO TEE  Result Date: 06/01/2022    TRANSESOPHOGEAL ECHO REPORT   Patient Name:   Austin Moreno Date of Exam: 06/01/2022 Medical Rec #:  [id number]           Height:       66.0 in Accession #:    [id number]          Weight:       191.0 lb Date of Birth:  09-Oct-1970           BSA:          1.961 m Patient Age:    68 years            BP:           148/94 mmHg Patient Gender: M                   HR:           73 bpm. Exam Location:  Inpatient Procedure: Transesophageal Echo, Cardiac Doppler and Color Doppler Indications:     Stroke  History:         Patient has prior history of Echocardiogram examinations, most                   recent 05/19/2022. Risk Factors:Hypertension and Dyslipidemia.  Sonographer:     Clayton Lefort RDCS (AE) Referring Phys:  [place] Diagnosing Phys: Kirk Ruths MD PROCEDURE: After discussion of the risks and benefits of a TEE, an informed consent was obtained from the patient. The transesophogeal probe was passed without difficulty through the esophogus of the patient. Sedation performed by different physician. The patient was monitored while under deep sedation. Anesthestetic sedation was provided intravenously by Anesthesiology: 264.85mg  of Propofol, 60mg  of Lidocaine. Image quality was good. The patient developed no complications during the procedure.  IMPRESSIONS  1. 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  2. Left ventricular ejection fraction, by estimation, is 55 to 60%. The left ventricle has normal function. The left ventricle has no regional wall motion abnormalities.  3. Right ventricular systolic function is normal. The right ventricular size is normal.  4. No left atrial/left atrial appendage thrombus was detected.  5. The mitral valve is normal in structure. Mild mitral valve regurgitation.  6. The aortic valve is calcified. Aortic valve regurgitation is mild. No aortic stenosis is present.  7. There is mild (Grade II) plaque involving the descending aorta.  8. Agitated saline contrast bubble study was positive with shunting observed after >6 cardiac cycles suggestive of intrapulmonary shunting. FINDINGS  Left Ventricle: Left ventricular ejection fraction, by estimation, is 55 to 60%. The left ventricle has normal function. The left ventricle has no regional wall motion abnormalities. The left ventricular internal cavity size was normal in size. Right Ventricle: The right ventricular size is normal. Right ventricular systolic function is normal. Left Atrium: Left atrial size  was normal in  size. Spontaneous echo contrast was present in the left atrium. No left atrial/left atrial appendage thrombus was detected. Right Atrium: Right atrial size was normal in size. Pericardium: There is no evidence of pericardial effusion. Mitral Valve: The mitral valve is normal in structure. Mild mitral annular calcification. Mild mitral valve regurgitation. MV peak gradient, 11.0 mmHg. The mean mitral valve gradient is 3.0 mmHg. Tricuspid Valve: The tricuspid valve is normal in structure. Tricuspid valve regurgitation is mild. Aortic Valve: The aortic valve is calcified. Aortic valve regurgitation is mild. No aortic stenosis is present. Pulmonic Valve: The pulmonic valve was normal in structure. Pulmonic valve regurgitation is trivial. Aorta: The aortic root is normal in size and structure. There is mild (Grade II) plaque involving the descending aorta. IAS/Shunts: No atrial level shunt detected by color flow Doppler. Agitated saline contrast bubble study was positive with shunting observed after >6 cardiac cycles suggestive of intrapulmonary shunting. 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  AORTA Ao Root diam: 2.90 cm Ao Asc diam:  3.60 cm MITRAL VALVE            TRICUSPID VALVE MV Peak grad: 11.0 mmHg TR Peak grad:   39.2 mmHg MV Mean grad: 3.0 mmHg  TR Vmax:        313.00 cm/s MV Vmax:      1.66 m/s MV Vmean:     74.2 cm/s Kirk Ruths MD Electronically signed by Kirk Ruths MD Signature Date/Time: 06/01/2022/12:44:23 PM    Final    VAS [place] LOWER EXTREMITY VENOUS (DVT)  Result Date: 05/31/2022  Lower Venous DVT Study Patient Name:  Austin Moreno  Date of Exam:   05/31/2022 Medical Rec #: [id number]            Accession #:    [id number] Date of Birth: [birth year]            Patient Gender: M Patient Age:   27 years Exam Location:  [facility] Procedure:      VAS [place] LOWER EXTREMITY VENOUS  (DVT) Referring Phys: Cornelius Moras XU --------------------------------------------------------------------------------  Indications: Stroke.  Risk Factors: None identified. Limitations: Poor ultrasound/tissue interface. Comparison Study: No prior studies. Performing Technologist: Oliver Hum RVT  Examination Guidelines: A complete evaluation includes B-mode imaging, spectral Doppler, color Doppler, and power Doppler as needed of all accessible portions of each vessel. Bilateral testing is considered an integral part of a complete examination. Limited examinations for reoccurring indications may be performed as noted. The reflux portion of the exam is performed with the patient in reverse Trendelenburg.  +---------+---------------+---------+-----------+----------+--------------+ RIGHT    CompressibilityPhasicitySpontaneityPropertiesThrombus Aging +---------+---------------+---------+-----------+----------+--------------+ CFV      Full           Yes      Yes                                 +---------+---------------+---------+-----------+----------+--------------+ SFJ      Full                                                        +---------+---------------+---------+-----------+----------+--------------+ FV Prox  Full                                                        +---------+---------------+---------+-----------+----------+--------------+  FV Mid   Full                                                        +---------+---------------+---------+-----------+----------+--------------+ FV DistalFull           Yes      Yes                                 +---------+---------------+---------+-----------+----------+--------------+ PFV      Full                                                        +---------+---------------+---------+-----------+----------+--------------+ POP      Full           Yes      Yes                                  +---------+---------------+---------+-----------+----------+--------------+ PTV      Full                                                        +---------+---------------+---------+-----------+----------+--------------+ PERO     Full                                                        +---------+---------------+---------+-----------+----------+--------------+   +---------+---------------+---------+-----------+----------+--------------+ LEFT     CompressibilityPhasicitySpontaneityPropertiesThrombus Aging +---------+---------------+---------+-----------+----------+--------------+ CFV      Full           Yes      Yes                                 +---------+---------------+---------+-----------+----------+--------------+ SFJ      Full                                                        +---------+---------------+---------+-----------+----------+--------------+ FV Prox  Full                                                        +---------+---------------+---------+-----------+----------+--------------+ FV Mid   Full                                                        +---------+---------------+---------+-----------+----------+--------------+  FV DistalFull                                                        +---------+---------------+---------+-----------+----------+--------------+ PFV      Full                                                        +---------+---------------+---------+-----------+----------+--------------+ POP      Full           Yes      Yes                                 +---------+---------------+---------+-----------+----------+--------------+ PTV      Full                                                        +---------+---------------+---------+-----------+----------+--------------+ PERO     Full                                                         +---------+---------------+---------+-----------+----------+--------------+     Summary: RIGHT: - There is no evidence of deep vein thrombosis in the lower extremity. However, portions of this examination were limited- see technologist comments above.  - No cystic structure found in the popliteal fossa.  LEFT: - There is no evidence of deep vein thrombosis in the lower extremity.  - No cystic structure found in the popliteal fossa.  *See table(s) above for measurements and observations. Electronically signed by Servando Snare MD on 05/31/2022 at 7:00:58 PM.    Final    MR BRAIN WO CONTRAST  Addendum Date: 05/31/2022   ADDENDUM REPORT: 05/31/2022 08:58 ADDENDUM: On further review there are additional sites diffusion restriction in the anterior right temporal lobe and the left thalamus. 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 Electronically Signed   By: Marin Roberts M.D.   On: 05/31/2022 08:58   Result Date: 05/31/2022 CLINICAL DATA:  Stroke follow-up EXAM: MRI HEAD WITHOUT CONTRAST TECHNIQUE: Multiplanar, multiecho pulse sequences of the brain and surrounding structures were obtained without intravenous contrast. 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 Sequela of mild to moderate chronic microvascular ischemic change. T2/FLAIR hyperintense signal is also seen involving the genu of the corpus callosum. Advanced generalized volume loss. Vascular: Normal flow voids. Skull and upper cervical spine: Normal marrow signal. Small disc bulge at C3-C4 that likely results in at least mild spinal canal narrowing. Sinuses/Orbits: Trace left mastoid effusion. Bilateral lens replacement. Other: None. IMPRESSION: 1.  Acute infarct involving the posterior limb  of the internal capsule on the left. No hemorrhage. 2. 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 Electronically Signed: By: Marin Roberts M.D. On: 05/30/2022 17:46   CT ANGIO HEAD NECK W WO CM  Result Date: 05/31/2022 CLINICAL DATA:  Right facial droop EXAM: CT ANGIOGRAPHY HEAD AND NECK TECHNIQUE: Multidetector CT imaging of the head and neck was performed using the standard protocol during bolus administration of intravenous contrast. Multiplanar CT image reconstructions and MIPs were obtained to evaluate the vascular anatomy. Carotid stenosis measurements (when applicable) are obtained utilizing NASCET criteria, using the distal internal carotid diameter as the denominator. RADIATION DOSE REDUCTION: This exam was performed according to the departmental dose-optimization program which includes automated exposure control, adjustment of the mA and/or kV according to patient size and/or use of iterative reconstruction technique. CONTRAST:  75mL OMNIPAQUE IOHEXOL 350 MG/ML SOLN COMPARISON:  05/30/2022 brain MRI FINDINGS: CT HEAD FINDINGS Brain: There is no mass, hemorrhage or extra-axial collection. The size and configuration of the ventricles and extra-axial CSF spaces are normal. 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 Multifocal parenchymal calcification. Skull: The visualized skull base, calvarium and extracranial soft tissues are normal. Sinuses/Orbits: No fluid levels or advanced mucosal thickening of the visualized paranasal sinuses. No mastoid or middle ear effusion. The orbits are normal. CTA NECK FINDINGS SKELETON: There is no bony spinal canal stenosis. No lytic or blastic lesion. OTHER NECK: Normal pharynx, larynx and major salivary glands. No cervical lymphadenopathy. Unremarkable thyroid gland.  UPPER CHEST: No pneumothorax or pleural effusion. No nodules or masses. AORTIC ARCH: There is calcific atherosclerosis of the aortic arch. There is no aneurysm, dissection or hemodynamically significant stenosis of the visualized portion of the aorta. Conventional 3 vessel aortic branching pattern. The visualized proximal subclavian arteries are widely patent. RIGHT CAROTID SYSTEM: No dissection, occlusion or aneurysm. There is mixed density atherosclerosis extending into the proximal ICA, resulting in less than 50% stenosis. There is irregular noncalcified plaque along the wall of the distal ICA to the level of the skull base LEFT CAROTID SYSTEM: No dissection, occlusion or aneurysm. There is mixed density atherosclerosis extending into the proximal ICA, resulting in 50% stenosis. VERTEBRAL ARTERIES: Left dominant configuration. Both origins are clearly patent. There is no dissection, occlusion or flow-limiting stenosis to the skull base (V1-V3 segments). CTA HEAD FINDINGS POSTERIOR CIRCULATION: --Vertebral arteries: Normal V4 segments. --Inferior cerebellar arteries: Normal. --Basilar artery: Normal. --Superior cerebellar arteries: Normal. --Posterior cerebral arteries (PCA): Normal. ANTERIOR CIRCULATION: --Intracranial internal carotid arteries: Normal. --Anterior cerebral arteries (ACA): Atherosclerotic irregularity of both anterior cerebral arteries --Middle cerebral arteries (MCA): Normal. VENOUS SINUSES: As permitted by contrast timing, patent. ANATOMIC VARIANTS: None Review of the MIP images confirms the above findings. IMPRESSION: 1. No emergent large vessel occlusion or high-grade stenosis of the intracranial arteries. 2. 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 3. 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 Irregular noncalcified plaque of the distal right internal carotid artery to the level of the skull base, which could indicate a source of  embolic disease. Electronically Signed   By: Ulyses Jarred M.D.   On: 05/31/2022 00:47   ECHOCARDIOGRAM COMPLETE  Result Date: 05/19/2022    ECHOCARDIOGRAM REPORT   Patient Name:   Austin Moreno Date of Exam: 05/19/2022 Medical Rec #:  [id number]           Height:  62.0 in Accession #:    [id number]          Weight:       206.0 lb Date of Birth:  1971/03/26           BSA:          1.936 m Patient Age:    56 years            BP:           142/89 mmHg Patient Gender: M                   HR:           79 bpm. Exam Location:  Inpatient Procedure: 2D Echo, Cardiac Doppler, Color Doppler and Intracardiac            Opacification Agent Indications:    Chest pain  History:        Patient has no prior history of Echocardiogram examinations.                 Risk Factors:Hypertension and Dyslipidemia.  Sonographer:    Clayton Lefort RDCS (AE) Referring Phys: [id number] BELAL A SULEIMAN  Sonographer Comments: Suboptimal apical window. IMPRESSIONS  1. Left ventricular ejection fraction, by estimation, is 55 to 60%. The left ventricle has normal function. The left ventricle has no regional wall motion abnormalities. There is mild left ventricular hypertrophy. Left ventricular diastolic parameters are indeterminate.  2. Right ventricular systolic function is normal. The right ventricular size is normal.  3. Mild mitral valve regurgitation. Moderate mitral annular calcification.  4. The aortic valve is tricuspid. Aortic valve regurgitation is not visualized.  5. The inferior vena cava is dilated in size with <50% respiratory variability, suggesting right atrial pressure of 15 mmHg. FINDINGS  Left Ventricle: Left ventricular ejection fraction, by estimation, is 55 to 60%. The left ventricle has normal function. The left ventricle has no regional wall motion abnormalities. Definity contrast agent was given IV to delineate the left ventricular  endocardial borders. The left ventricular internal cavity size was normal in size.  There is mild left ventricular hypertrophy. Left ventricular diastolic parameters are indeterminate. Right Ventricle: The right ventricular size is normal. Right vetricular wall thickness was not assessed. Right ventricular systolic function is normal. Left Atrium: Left atrial size was normal in size. Right Atrium: Right atrial size was normal in size. Pericardium: There is no evidence of pericardial effusion. Mitral Valve: There is mild thickening of the mitral valve leaflet(s). Moderate mitral annular calcification. Mild mitral valve regurgitation. MV peak gradient, 12.8 mmHg. The mean mitral valve gradient is 4.0 mmHg. Tricuspid Valve: The tricuspid valve is normal in structure. Tricuspid valve regurgitation is trivial. Aortic Valve: The aortic valve is tricuspid. Aortic valve regurgitation is not visualized. Aortic valve mean gradient measures 3.0 mmHg. Aortic valve peak gradient measures 6.6 mmHg. Aortic valve area, by VTI measures 2.62 cm. Pulmonic Valve: The pulmonic valve was grossly normal. Pulmonic valve regurgitation is not visualized. No evidence of pulmonic stenosis. Aorta: The aortic root is normal in size and structure. Venous: The inferior vena cava is dilated in size with less than 50% respiratory variability, suggesting right atrial pressure of 15 mmHg. IAS/Shunts: No atrial level shunt detected by color flow Doppler.  LEFT VENTRICLE PLAX 2D LVIDd:         4.50 cm   Diastology LVIDs:         3.10 cm   LV e' medial:    4.13  cm/s LV PW:         1.30 cm   LV E/e' medial:  42.1 LV IVS:        1.50 cm   LV e' lateral:   8.27 cm/s LVOT diam:     2.10 cm   LV E/e' lateral: 21.0 LV SV:         59 LV SV Index:   30 LVOT Area:     3.46 cm  RIGHT VENTRICLE             IVC RV Basal diam:  3.30 cm     IVC diam: 2.60 cm RV S prime:     13.30 cm/s TAPSE (M-mode): 1.2 cm LEFT ATRIUM             Index        RIGHT ATRIUM           Index LA diam:        4.30 cm 2.22 cm/m   RA Area:     19.50 cm LA Vol (A2C):    75.7 ml 39.10 ml/m  RA Volume:   54.00 ml  27.89 ml/m LA Vol (A4C):   74.6 ml 38.54 ml/m LA Biplane Vol: 79.5 ml 41.07 ml/m  AORTIC VALVE AV Area (Vmax):    2.58 cm AV Area (Vmean):   2.60 cm AV Area (VTI):     2.62 cm AV Vmax:           128.00 cm/s AV Vmean:          82.600 cm/s AV VTI:            0.223 m AV Peak Grad:      6.6 mmHg AV Mean Grad:      3.0 mmHg LVOT Vmax:         95.50 cm/s LVOT Vmean:        61.900 cm/s LVOT VTI:          0.169 m LVOT/AV VTI ratio: 0.76  AORTA Ao Root diam: 3.20 cm MITRAL VALVE MV Area (PHT): 3.79 cm     SHUNTS MV Area VTI:   1.61 cm     Systemic VTI:  0.17 m MV Peak grad:  12.8 mmHg    Systemic Diam: 2.10 cm MV Mean grad:  4.0 mmHg MV Vmax:       1.79 m/s MV Vmean:      92.3 cm/s MV Decel Time: 200 msec MV E velocity: 174.00 cm/s MV A velocity: 53.10 cm/s MV E/A ratio:  3.28 Dietrich Pates MD Electronically signed by Dietrich Pates MD Signature Date/Time: 05/19/2022/12:40:27 PM    Final    CT Angio Chest/Abd/Pel for Dissection W and/or W/WO  Result Date: 05/18/2022 CLINICAL DATA:  Head and stomach pain since yesterday, hypertension EXAM: CT ANGIOGRAPHY CHEST, ABDOMEN AND PELVIS TECHNIQUE: Non-contrast CT of the chest was initially obtained. Multidetector CT imaging through the chest, abdomen and pelvis was performed using the standard protocol during bolus administration of intravenous contrast. Multiplanar reconstructed images and MIPs were obtained and reviewed to evaluate the vascular anatomy. RADIATION DOSE REDUCTION: This exam was performed according to the departmental dose-optimization program which includes automated exposure control, adjustment of the mA and/or kV according to patient size and/or use of iterative reconstruction technique. CONTRAST:  31mL OMNIPAQUE IOHEXOL 350 MG/ML SOLN COMPARISON:  08/29/2018 FINDINGS: CTA CHEST FINDINGS Cardiovascular: No evidence of thoracic aortic aneurysm or dissection. Mild cardiomegaly without pericardial effusion.  Atherosclerosis of the coronary vasculature.  Mediastinum/Nodes: No enlarged mediastinal, hilar, or axillary lymph nodes. Thyroid gland, trachea, and esophagus demonstrate no significant findings. Lungs/Pleura: Trace bilateral pleural effusions, right greater than left. No airspace disease or pneumothorax. Central airways are patent. Musculoskeletal: There are no acute or destructive bony lesions. Reconstructed images demonstrate no additional findings. Review of the MIP images confirms the above findings. CTA ABDOMEN AND PELVIS FINDINGS VASCULAR Aorta: Normal caliber aorta without aneurysm, dissection, vasculitis or significant stenosis. Atherosclerosis of the distal aorta. Celiac: Patent without evidence of aneurysm, dissection, vasculitis or significant stenosis. SMA: Patent without evidence of aneurysm, dissection, vasculitis or significant stenosis. Renals: Both renal arteries are patent without evidence of aneurysm, dissection, vasculitis, fibromuscular dysplasia or significant stenosis. IMA: Patent without evidence of aneurysm, dissection, vasculitis or significant stenosis. Inflow: Patent without evidence of aneurysm, dissection, vasculitis or significant stenosis. Veins: No obvious venous abnormality within the limitations of this arterial phase study. Review of the MIP images confirms the above findings. NON-VASCULAR Hepatobiliary: Nodularity of the liver capsule and hypertrophy of the left lobe liver consistent with cirrhosis. No focal parenchymal liver abnormality. Gallbladder is unremarkable. Pancreas: Unremarkable. No pancreatic ductal dilatation or surrounding inflammatory changes. Spleen: Normal in size without focal abnormality. Adrenals/Urinary Tract: Adrenal glands are unremarkable. Kidneys are normal, without renal calculi, focal lesion, or hydronephrosis. Bladder is unremarkable. Stomach/Bowel: No bowel obstruction or ileus. Normal appendix right lower quadrant. No bowel wall thickening or  inflammatory change. Lymphatic: No pathologic adenopathy. Reproductive: Prostate is unremarkable. Other: No free fluid or free intraperitoneal gas. No abdominal wall hernia. Musculoskeletal: No acute or destructive bony lesions. Reconstructed images demonstrate no additional findings. Review of the MIP images confirms the above findings. IMPRESSION: 1. No evidence of thoracoabdominal aortic aneurysm or dissection. 2. Trace bilateral pleural effusions, right greater than left. 3. Nodular liver capsule and left lobe hypertrophy compatible with cirrhosis. 4.  Aortic Atherosclerosis (ICD10-I70.0). Electronically Signed   By: Randa Ngo M.D.   On: 05/18/2022 20:39   CT Head Wo Contrast  Result Date: 05/18/2022 CLINICAL DATA:  Sudden severe headaches EXAM: CT HEAD WITHOUT CONTRAST TECHNIQUE: Contiguous axial images were obtained from the base of the skull through the vertex without intravenous contrast. RADIATION DOSE REDUCTION: This exam was performed according to the departmental dose-optimization program which includes automated exposure control, adjustment of the mA and/or kV according to patient size and/or use of iterative reconstruction technique. COMPARISON:  10/01/2013 FINDINGS: Brain: No acute intracranial findings are seen. There are no signs of bleeding within the cranium. Ventricles are not dilated. There are multiple calcifications in both cerebral hemispheres with no significant interval change. There is no adjacent edema or mass effect. This finding may suggest presence of multiple healed granulomas or residual changes from some other inflammatory process such as cysticercosis. Vascular: Unremarkable. Skull: There is decreased in number of air cells in mastoids. No fracture is seen. Sinuses/Orbits: No acute findings are seen. Other: No significant interval changes are noted. IMPRESSION: No acute intracranial findings are seen in noncontrast CT brain. There are few scattered calcifications of  varying sizes in both cerebral hemispheres with no interval change since 10/01/2013 suggesting residual change from previous inflammation. Electronically Signed   By: Elmer Picker M.D.   On: 05/18/2022 12:23    Microbiology: Results for orders placed or performed during the hospital encounter of 05/18/22  Resp Panel by RT-PCR (Flu A&B, Covid)     Status: None   Collection Time: 05/18/22  4:00 PM   Specimen: Nasal Swab  Result Value  Ref Range Status   SARS Coronavirus 2 by RT PCR NEGATIVE NEGATIVE Final    Comment: (NOTE) SARS-CoV-2 target nucleic acids are NOT DETECTED.  The SARS-CoV-2 RNA is generally detectable in upper respiratory specimens during the acute phase of infection. The lowest concentration of SARS-CoV-2 viral copies this assay can detect is 138 copies/mL. A negative result does not preclude SARS-Cov-2 infection and should not be used as the sole basis for treatment or other patient management decisions. A negative result may occur with  improper specimen collection/handling, submission of specimen other than nasopharyngeal swab, presence of viral mutation(s) within the areas targeted by this assay, and inadequate number of viral copies(<138 copies/mL). A negative result must be combined with clinical observations, patient history, and epidemiological information. The expected result is Negative.  Fact Sheet for Patients:  EntrepreneurPulse.com.au  Fact Sheet for Healthcare Providers:  IncredibleEmployment.be  This test is no t yet approved or cleared by the [place] FDA and  has been authorized for detection and/or diagnosis of SARS-CoV-2 by FDA under an Emergency Use Authorization (EUA). This EUA will remain  in effect (meaning this test can be used) for the duration of the COVID-19 declaration under Section 564(b)(1) of the Act, 21 U.S.C.section 360bbb-3(b)(1), unless the authorization is terminated  or revoked  sooner.       Influenza A by PCR NEGATIVE NEGATIVE Final   Influenza B by PCR NEGATIVE NEGATIVE Final    Comment: (NOTE) The Xpert Xpress SARS-CoV-2/FLU/RSV plus assay is intended as an aid in the diagnosis of influenza from Nasopharyngeal swab specimens and should not be used as a sole basis for treatment. Nasal washings and aspirates are unacceptable for Xpert Xpress SARS-CoV-2/FLU/RSV testing.  Fact Sheet for Patients: EntrepreneurPulse.com.au  Fact Sheet for Healthcare Providers: IncredibleEmployment.be  This test is not yet approved or cleared by the [place] FDA and has been authorized for detection and/or diagnosis of SARS-CoV-2 by FDA under an Emergency Use Authorization (EUA). This EUA will remain in effect (meaning this test can be used) for the duration of the COVID-19 declaration under Section 564(b)(1) of the Act, 21 U.S.C. section 360bbb-3(b)(1), unless the authorization is terminated or revoked.  Performed at [facility], [place] [address] [place], [place] [address]     Labs: CBC: Recent Labs  Lab 05/30/22 1546 05/30/22 1618 05/31/22 0512 06/01/22 0710  WBC 11.0*  --  11.7* 9.8  NEUTROABS 6.1  --   --  5.4  HGB 13.8 14.6 13.9 14.1  HCT 41.6 43.0 42.9 41.9  MCV 87.9  --  89.0 86.7  PLT 308  --  289 [id number]   Basic Metabolic Panel: Recent Labs  Lab 05/30/22 1546 05/30/22 1618 05/31/22 0512 06/01/22 0710  NA 137 138 136 134*  K 4.3 4.3 3.8 3.7  CL 107 105 107 106  CO2 22  --  24 21*  GLUCOSE 186* 188* 117* 117*  BUN 28* 30* 24* 17  CREATININE 1.32* 1.40* 1.22 1.19  CALCIUM 9.4  --  8.6* 8.5*   Liver Function Tests: Recent Labs  Lab 05/30/22 1546  AST 28  ALT 40  ALKPHOS 48  BILITOT 0.4  PROT 7.7  ALBUMIN 3.1*   CBG: Recent Labs  Lab 05/30/22 1536  GLUCAP 200*    Discharge time spent: approximately 35 minutes spent on discharge counseling, evaluation of patient on day of discharge, and  coordination of discharge planning with nursing, social work, pharmacy and case management  Signed: Edwin Dada,  MD [facility] 06/01/2022

## 2022-06-03 ENCOUNTER — Encounter (HOSPITAL_COMMUNITY): Payer: Self-pay | Admitting: Cardiology

## 2022-06-06 ENCOUNTER — Ambulatory Visit: Payer: No Typology Code available for payment source | Admitting: Student

## 2022-06-09 ENCOUNTER — Ambulatory Visit: Payer: 59 | Attending: Cardiology

## 2022-06-09 DIAGNOSIS — I4891 Unspecified atrial fibrillation: Secondary | ICD-10-CM

## 2022-06-09 DIAGNOSIS — I639 Cerebral infarction, unspecified: Secondary | ICD-10-CM | POA: Diagnosis not present

## 2022-06-13 ENCOUNTER — Ambulatory Visit (INDEPENDENT_AMBULATORY_CARE_PROVIDER_SITE_OTHER): Payer: 59 | Admitting: Student

## 2022-06-13 VITALS — BP 113/72 | HR 57 | Wt 192.0 lb

## 2022-06-13 DIAGNOSIS — I639 Cerebral infarction, unspecified: Secondary | ICD-10-CM

## 2022-06-13 NOTE — Progress Notes (Signed)
Patient not seen as he has already had a hospital follow up with family medicine. No acute complaints during triage. Patient will not be charged for this encounter.

## 2022-06-13 NOTE — Patient Instructions (Signed)
  CVA 05/30/22 HFU for acute CVA. MRI brain showed some old calcified foci in bilateral cerebral hemispheres, consistent with a history of neurocysticercosis, a new acute infarct in posterior limb of left IC, and restricted diffusion in anterior R temporal lobe and left thalamus, consistent with embolism. - echo and carotid studies. 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 Cirrhotic morphology noted on CT 05/18/2022.    Hyperlipidemia  A1c 11/16 -6.4 - prediabetes    HM

## 2022-06-14 ENCOUNTER — Ambulatory Visit: Payer: 59 | Attending: Family Medicine

## 2022-06-14 DIAGNOSIS — R41841 Cognitive communication deficit: Secondary | ICD-10-CM | POA: Insufficient documentation

## 2022-06-14 DIAGNOSIS — R471 Dysarthria and anarthria: Secondary | ICD-10-CM | POA: Insufficient documentation

## 2022-06-14 NOTE — Patient Instructions (Addendum)
LENTO ALTO SOBREENNUNCIAR PAUSA  [place] de indicaciones: 1. Descripcin: defina la palabra o proporcione la funcin. 2. Primera letra: proporcione la primera letra de la palabra objetivo. 3. Deletrear: deletrea la palabra objetivo, de forma oral 4. Completar frases: proporcione una frase para completar 5. Primer sonido: proporcione Financial risk analyst sonido de la palabra objetivo. 6. Modele: diga la palabra en voz alta para repetirla.  Objetivo de ejemplo: "taza" 1. En qu le pones el caf 2. Comienza con m 3. M - U - G 4. Llene mi caf _____ 5. "Mmm" 6. Taza

## 2022-06-14 NOTE — Therapy (Signed)
OUTPATIENT SPEECH LANGUAGE PATHOLOGY EVALUATION   Patient Name: Austin Moreno MRN: [id number] DOB:[birth year], 51 y.o., male Today's Date: 06/14/2022  PCP: None REFERRING PROVIDER: Edwin Dada, MD  END OF SESSION:  End of Session - 06/14/22 0912     Visit Number 1    Number of Visits 9    Date for SLP Re-Evaluation 08/09/22    Authorization Type [employer] CVA    Authorization - Visit Number 2    Authorization - Number of Visits 42    SLP Start Time [phone]    SLP Stop Time  1015    SLP Time Calculation (min) 47 min    Activity Tolerance Patient tolerated treatment well             Past Medical History:  Diagnosis Date   Allergy    Has a nurse friend who gives him a shot every year for his allergies--sounds like corticosteroids   Hyperlipidemia    With good HDL   Hypertension    Obesity (BMI 30-39.9) 2016   Prediabetes    Past Surgical History:  Procedure Laterality Date   BUBBLE STUDY  06/01/2022   Procedure: BUBBLE STUDY;  Surgeon: Lelon Perla, MD;  Location: [facility];  Service: Cardiovascular;;   TEE WITHOUT CARDIOVERSION N/A 06/01/2022   Procedure: TRANSESOPHAGEAL ECHOCARDIOGRAM (TEE);  Surgeon: Lelon Perla, MD;  Location: [facility] ENDOSCOPY;  Service: Cardiovascular;  Laterality: N/A;   Patient Active Problem List   Diagnosis Date Noted   Acute CVA (cerebrovascular accident) ([place]) 05/30/2022   Hypertensive emergency [id number]   Acute diastolic CHF (congestive heart failure) ([place]) 05/19/2022   Cirrhosis of liver ([place]) 05/19/2022   Hypokalemia 05/19/2022   Hypertensive urgency 05/18/2022   Chest pain 05/18/2022   Elevated troponin [id number]   Alcoholic cirrhosis of liver with ascites ([place]) 05/18/2022   Obesity 08/16/2016   Prediabetes 12/03/2015   Hypertension    Hyperlipidemia    Allergy     ONSET DATE: 05/30/22   REFERRING DIAG: I63.9 (ICD-10-CM) - Cerebrovascular accident (CVA), unspecified mechanism  THERAPY DIAG:   Dysarthria and anarthria  Cognitive communication deficit  Rationale for Evaluation and Treatment: Rehabilitation  SUBJECTIVE:   SUBJECTIVE STATEMENT: "I forget the letters of the words" Pt accompanied by: significant other and interpreter  PERTINENT HISTORY: Austin Moreno is a 50 y.o. male with history of hypertension, hyperlipidemia, prediabetes, recent hypertensive emergency admission now admitted for right facial droop, slurred speech. 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 Yes: NPRS scale: 3-4/10 Pain location: teeth Pain description: left side  Aggravating factors: eating Relieving factors: None  FALLS: Has patient fallen in last 6 months?  No  LIVING ENVIRONMENT: Lives with: lives with their family Lives in: House/apartment  PLOF:  Level of assistance: Independent with ADLs, Independent with IADLs Employment: Full-time employment  PATIENT GOALS: improve speech   OBJECTIVE:   COGNITION: Overall cognitive status: Within functional limits for tasks assessed Functional deficits: Pt and wife denied cognitive changes s/p stroke. No overt s/sx of cognitive impairment exhibited this session but will continue to monitor and address if needed   AUDITORY COMPREHENSION: Overall auditory comprehension: Appears intact  READING COMPREHENSION: Not tested  EXPRESSION: verbal  VERBAL EXPRESSION: Level of generative/spontaneous verbalization: conversation Automatic speech: social response: intact  Repetition: Appears intact Naming: Responsive: 76-100% and Confrontation: 76-100% Pragmatics: Appears intact Effective technique: semantic cues, phonemic cues,  and written cues Comments: c/o word finding deficits; occasional anomia/dysnomia noted during BNT (could be in part attributed to language differences)  WRITTEN EXPRESSION: Dominant  hand: right Written expression: Not tested  MOTOR SPEECH: Overall motor speech: impaired Level of impairment: Sentence and Conversation Respiration: clavicular breathing Phonation: normal Resonance: WFL Articulation: Impaired: conversation Intelligibility: Intelligibility reduced Motor planning: Appears intact Effective technique: slow rate and over articulate  ORAL MOTOR EXAMINATION: Overall status: Impaired:   Labial: Right (ROM, Symmetry, and Coordination) Lingual: Right (ROM, Symmetry, and Coordination) Comments: Impaired right labial and lingual ROM, strength, and coordination mildly impacting speech intelligibility, anterior spillage, and pocketing  STANDARDIZED ASSESSMENTS: [place] NAMING TEST: subjectively rated due to possible language differences. Dysnomia x3 noted, which benefited from multiple choice and semantic cues   PATIENT REPORTED OUTCOME MEASURES (PROM): Not completed due to time constraints and language differences    TODAY'S TREATMENT:                                                                                                                                          06-14-22: Educated patient and wife on dysarthria strategies and word retrieval compensations. Pt able to demo dysarthria strategies at word level with rare min A. Further education and training required for more complex communication tasks and scenarios. Both verbalized understanding and agreement with SLP recommendations and initiation of POC.    PATIENT EDUCATION: Education details: see above Person educated: Patient and Spouse Education method: Explanation, Demonstration, and Handouts Education comprehension: verbalized understanding, returned demonstration, and needs further education   GOALS: Goals reviewed with patient? Yes  SHORT TERM GOALS: Target date: 07/12/2022  Pt will utilize dysarthria strategies during structured speech tasks to be >90% intelligible given occasional min  A Baseline: Goal status: INITIAL  2.  Pt will carryover dysarthria compensations in 5-10 minute conversation to be >90% intelligible given occasional min A  Baseline:  Goal status: INITIAL  3.  Pt will demonstrate ability to utilize word retrieval strategies on structured speech tasks given occasional min A  Baseline:  Goal status: INITIAL  LONG TERM GOALS: Target date: 08/09/2022  Pt will carryover dysarthria compensations in 20+ minute conversation to be 95+% intelligible with rare min A Baseline:  Goal status: INITIAL  2.  Pt will employ word retrieval strategies as needed in 20+ minute conversation with rare min A Baseline:  Goal status: INITIAL  3.  Pt will subjectively report improved communication effectiveness with self-rated increased satisfaction by last ST session  Baseline:  Goal status: INITIAL   ASSESSMENT:  CLINICAL IMPRESSION: Patient is a 51 y.o. M who was seen today for CVA. Pt presents with mild dysarthria and occasional word retrieval difficulty impacting overall communication effectiveness. Mildly reduced labial and lingual ROM, coordination, and strength exhibited impacting conversation level speech intelligibility and resulting in occasional anterior spillage and pocketing (clears with lingual sweep per patient reports). Denied  any other concerns for swallowing or cognition. Stimulable for use of dysarthria strategies on structured task for increased intelligibility. C/o word finding, which was evidenced in reduced confrontation naming today. Pt would benefit from skilled ST intervention to address speech and language impairments to maximize return to prior baseline.   OBJECTIVE IMPAIRMENTS: include expressive language and dysarthria. These impairments are limiting patient from effectively communicating at home and in community. Factors affecting potential to achieve goals and functional outcome are ability to learn/carryover information. Patient will benefit  from skilled SLP services to address above impairments and improve overall function.  REHAB POTENTIAL: Good  PLAN:  SLP FREQUENCY: 1x/week  SLP DURATION: 8 weeks  PLANNED INTERVENTIONS: Language facilitation, Environmental controls, Cueing hierachy, Cognitive reorganization, Internal/external aids, Functional tasks, Multimodal communication approach, SLP instruction and feedback, Compensatory strategies, Patient/family education, and Re-evaluation    Marzetta Board, CCC-SLP 06/14/2022, 12:16 PM

## 2022-06-22 NOTE — Therapy (Unsigned)
OUTPATIENT SPEECH LANGUAGE PATHOLOGY TREATMENT   Patient Name: Austin Moreno MRN: [id number] DOB:Sep 07, 1970, 51 y.o., male Today's Date: 06/22/2022  PCP: None REFERRING PROVIDER: Alberteen Sam, MD  END OF SESSION:    Past Medical History:  Diagnosis Date   Allergy    Has a nurse friend who gives him a shot every year for his allergies--sounds like corticosteroids   Hyperlipidemia    With good HDL   Hypertension    Obesity (BMI 30-39.9) 2016   Prediabetes    Past Surgical History:  Procedure Laterality Date   BUBBLE STUDY  06/01/2022   Procedure: BUBBLE STUDY;  Surgeon: Lewayne Bunting, MD;  Location: [facility] ENDOSCOPY;  Service: Cardiovascular;;   TEE WITHOUT CARDIOVERSION N/A 06/01/2022   Procedure: TRANSESOPHAGEAL ECHOCARDIOGRAM (TEE);  Surgeon: Lewayne Bunting, MD;  Location: [facility] ENDOSCOPY;  Service: Cardiovascular;  Laterality: N/A;   Patient Active Problem List   Diagnosis Date Noted   Acute CVA (cerebrovascular accident) (HCC) 05/30/2022   Hypertensive emergency 05/19/2022   Acute diastolic CHF (congestive heart failure) (HCC) 05/19/2022   Cirrhosis of liver (HCC) 05/19/2022   Hypokalemia 05/19/2022   Hypertensive urgency 05/18/2022   Chest pain 05/18/2022   Elevated troponin 05/18/2022   Alcoholic cirrhosis of liver with ascites (HCC) 05/18/2022   Obesity 08/16/2016   Prediabetes 12/03/2015   Hypertension    Hyperlipidemia    Allergy     ONSET DATE: 05/30/22   REFERRING DIAG: I63.9 (ICD-10-CM) - Cerebrovascular accident (CVA), unspecified mechanism  THERAPY DIAG:  No diagnosis found.  Rationale for Evaluation and Treatment: Rehabilitation  SUBJECTIVE:   SUBJECTIVE STATEMENT: "*** Pt accompanied by: significant other and interpreter  PERTINENT HISTORY: [name] is a 51 y.o. male with history of hypertension, hyperlipidemia, prediabetes, recent hypertensive emergency admission now admitted for right facial droop, slurred  speech. 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 Yes: NPRS scale: 3-4/10 Pain location: teeth Pain description: left side  Aggravating factors: eating Relieving factors: None  PATIENT GOALS: improve speech   OBJECTIVE:   TODAY'S TREATMENT:                                                                                                                                         06-22-22:  06-14-22: Educated patient and wife on dysarthria strategies and word retrieval compensations. Pt able to demo dysarthria strategies at word level with rare min A. Further education and training required for more complex communication tasks and scenarios. Both verbalized understanding and agreement with SLP recommendations and initiation of POC.    PATIENT EDUCATION: Education details: see above Person educated: Patient and Spouse Education method: Explanation, Demonstration, and Handouts Education comprehension: verbalized understanding, returned demonstration, and needs further education   GOALS: Goals reviewed with patient? Yes  SHORT TERM  GOALS: Target date: 07/12/2022  Pt will utilize dysarthria strategies during structured speech tasks to be >90% intelligible given occasional min A Baseline: Goal status: IN PROGRESS  2.  Pt will carryover dysarthria compensations in 5-10 minute conversation to be >90% intelligible given occasional min A  Baseline:  Goal status: IN PROGRESS  3.  Pt will demonstrate ability to utilize word retrieval strategies on structured speech tasks given occasional min A  Baseline:  Goal status: IN PROGRESS  LONG TERM GOALS: Target date: 08/09/2022  Pt will carryover dysarthria compensations in 20+ minute conversation to be 95+% intelligible with rare min A Baseline:  Goal status: IN PROGRESS  2.  Pt will employ word retrieval strategies as  needed in 20+ minute conversation with rare min A Baseline:  Goal status: IN PROGRESS  3.  Pt will subjectively report improved communication effectiveness with self-rated increased satisfaction by last ST session  Baseline:  Goal status: IN PROGRESS   ASSESSMENT:  CLINICAL IMPRESSION: Patient is a 51 y.o. M who was seen today for CVA. Pt presents with mild dysarthria and occasional word retrieval difficulty impacting overall communication effectiveness. Mildly reduced labial and lingual ROM, coordination, and strength exhibited impacting conversation level speech intelligibility and resulting in occasional anterior spillage and pocketing (clears with lingual sweep per patient reports). Denied any other concerns for swallowing or cognition. Stimulable for use of dysarthria strategies on structured task for increased intelligibility. C/o word finding, which was evidenced in reduced confrontation naming today. Pt would benefit from skilled ST intervention to address speech and language impairments to maximize return to prior baseline.   OBJECTIVE IMPAIRMENTS: include expressive language and dysarthria. These impairments are limiting patient from effectively communicating at home and in community. Factors affecting potential to achieve goals and functional outcome are ability to learn/carryover information. Patient will benefit from skilled SLP services to address above impairments and improve overall function.  REHAB POTENTIAL: Good  PLAN:  SLP FREQUENCY: 1x/week  SLP DURATION: 8 weeks  PLANNED INTERVENTIONS: Language facilitation, Environmental controls, Cueing hierachy, Cognitive reorganization, Internal/external aids, Functional tasks, Multimodal communication approach, SLP instruction and feedback, Compensatory strategies, Patient/family education, and Re-evaluation    Marzetta Board, [place] 06/22/2022, 1:17 PM

## 2022-06-23 ENCOUNTER — Ambulatory Visit: Payer: 59

## 2022-06-23 DIAGNOSIS — R471 Dysarthria and anarthria: Secondary | ICD-10-CM | POA: Diagnosis not present

## 2022-06-23 DIAGNOSIS — R41841 Cognitive communication deficit: Secondary | ICD-10-CM | POA: Diagnosis not present

## 2022-06-29 ENCOUNTER — Ambulatory Visit: Payer: 59
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
## 2022-07-06 ENCOUNTER — Encounter: Payer: Self-pay | Admitting: Adult Health

## 2022-07-06 ENCOUNTER — Ambulatory Visit (INDEPENDENT_AMBULATORY_CARE_PROVIDER_SITE_OTHER): Payer: 59 | Admitting: Adult Health

## 2022-07-06 VITALS — BP 131/75 | HR 72 | Ht 65.0 in | Wt 192.0 lb

## 2022-07-06 DIAGNOSIS — Z09 Encounter for follow-up examination after completed treatment for conditions other than malignant neoplasm: Secondary | ICD-10-CM

## 2022-07-06 DIAGNOSIS — I639 Cerebral infarction, unspecified: Secondary | ICD-10-CM

## 2022-07-06 NOTE — Progress Notes (Signed)
I agree with the above plan 

## 2022-07-06 NOTE — Patient Instructions (Addendum)
Okay to return back to work at this time without restrictions as well as driving   Continue clopidogrel 75 mg daily  and Crestor for secondary stroke prevention Discontinue aspirin as continued use no longer indicated  Send cardiac monitor back to cardiology - instructions provided   Continue to follow up with PCP regarding blood pressure, cholesterol and pre-diabetes management  Maintain strict control of hypertension with blood pressure goal below 130/90, pre-diabetes with hemoglobin A1c goal below 7.0 % and cholesterol with LDL cholesterol (bad cholesterol) goal below 70 mg/dL.   Signs of a Stroke? Follow the BEFAST method:  Balance Watch for a sudden loss of balance, trouble with coordination or vertigo Eyes Is there a sudden loss of vision in one or both eyes? Or double vision?  Face: Ask the person to smile. Does one side of the face droop or is it numb?  Arms: Ask the person to raise both arms. Does one arm drift downward? Is there weakness or numbness of a leg? Speech: Ask the person to repeat a simple phrase. Does the speech sound slurred/strange? Is the person confused ? Time: If you observe any of these signs, call 911.       Thank you for coming to see [place] at [facility]. I hope we have been able to provide you high quality care today.  You may receive a patient satisfaction survey over the next few weeks. We would appreciate your feedback and comments so that we may continue to improve ourselves and the health of our patients.    Prevencin del accidente cerebrovascular Stroke Prevention Algunas afecciones mdicas y elecciones en el estilo de vida pueden conducir a un mayor riesgo de Warehouse manager un accidente cerebrovascular. Puede ayudar a prevenir un accidente cerebrovascular comiendo alimentos saludables y haciendo ejercicio. Tambin ayuda no fumar y Pension scheme manager de salud que usted pueda [place]. Cmo puede afectarme esta enfermedad? El  accidente cerebrovascular es Radio broadcast assistant. Se debe tratar de inmediato. Un accidente cerebrovascular puede provocar dao cerebral o poner en riesgo su vida. Hay una mayor probabilidad de sobrevivir y mejorar despus de un accidente cerebrovascular si obtiene ayuda mdica de inmediato. Qu puede aumentar el riesgo? Las siguientes enfermedades pueden aumentar el riesgo de esta afeccin: Enfermedades del corazn y de los vasos sanguneos (enfermedad cardiovascular). Presin arterial alta (hipertensin). Diabetes. Colesterol alto. Anemia drepanoctica. Problemas de coagulacin. Tener mucho sobrepeso. Problemas de sueo (apnea obstructivadel sueo). Otros factores de riesgo son los siguientes: Ser mayor de 60 aos de edad. Antecedentes de cogulos sanguneos, accidente cerebrovascular o miniaccidente cerebrovascular (accidente isqumico transitorio, AIT). Raza, origen tnico o antecedentes familiares de accidente cerebrovascular. Fumar o usar productos que contengan tabaco. Tomar pldoras anticonceptivas, especialmente si fuma. Consumo excesivo de alcohol y drogas. Ser sedentario. Qu medidas de prevencin puedo tomar? Controle su estado de salud Colesterol alto. Siga una dieta saludable. Si esto no es suficiente para controlar su colesterol, es posible que tenga que tomar medicamentos. Tome los [employer] como le indic el mdico. Presin arterial alta. Intente mantener su presin arterial por debajo de 130/80. Si su presin arterial no puede controlarse mediante una dieta saludable y ejercicio habitual, es posible que deba tomar medicamentos. Tome los medicamentos como le indic el mdico. Pregntele al mdico si debera controlar su presin arterial en su casa. Hgase controlar la presin arterial todos los aos. Diabetes. Siga una dieta saludable y haga ejercicio con regularidad. Si su nivel de azcar en la sangre (glucosa) no puede controlarse mediante dieta  y ejercicio, es  posible que deba tomar medicamentos. Tome los [employer] como le indic el mdico. Consulte a su mdico sobre la necesidad de una evaluacin para determinar si tiene problemas de sueo. Los signos de un problema pueden incluir los siguientes: Technical brewer. Mucho cansancio. Asegrese de controlar cualquier otra afeccin mdica que tenga. Nutricin  Siga las instrucciones del mdico respecto de las comidas o las bebidas. Es posible que le indiquen lo siguiente: [place] y beber [place]. Limitar la cantidad de sal (sodio) que [place] a 1500 miligramos (mg) por Training and development officer. Usar solo aceites saludables para Audiological scientist, como aceite de [place], aceite de canola y Scientist, research (life sciences) de [place]. Consumir alimentos saludables. Para hacer esto: Elija alimentos que sean ricos en fibra. Entre ellos, se incluyen los cereales integrales y las frutas y verduras frescas. Coma al menos 5 porciones de frutas y verduras Armed forces operational officer. Trate de llenar la mitad del plato de cada comida con frutas y verduras. Elija protenas con bajo contenido de grasa [place]). Estas incluyen cortes de carne bajos en grasa, pollo sin piel, pescado, tofu, frijoles y frutos secos. Consuma productos lcteos descremados. Evite alimentos que: Tengan mucha sal. Tengan grasas saturadas. Tengan grasas trans. Tengan colesterol. Sean procesados o preelaborados. Cuente cuntos carbohidratos come y Pharmacologist. Estilo de vida Si bebe alcohol: Limite la cantidad que bebe a lo siguiente: De 0 a 1 medida por da para las mujeres que no estn embarazadas. De 0 a 2 medidas por da para los hombres. Sepa cunta cantidad de alcohol hay en las bebidas que toma. En los [place], una medida equivale a una botella de cerveza de 12 oz (355 ml), un vaso de vino de 5 oz (148 ml) o un vaso de una bebida alcohlica de alta graduacin de 1 oz (44 ml). No fume ni consuma productos que contengan nicotina ni tabaco. Si necesita ayuda para dejar de fumar, consulte  al mdico. Evite ser fumador pasivo. No consuma drogas. Actividad  Intente mantener un peso saludable. Trate de hacer al menos 30 minutos de ejercicio casi todos [place], por ejemplo: Caminar rpidamente. Andar en bicicleta. Natacin. Medicamentos Use los medicamentos de venta libre y los recetados solamente como se lo haya indicado el mdico. Evite tomar pldoras anticonceptivas. Hable con su mdico acerca de los riesgos de tomar pldoras anticonceptivas si: Es mayor de 23 aos. Fuma. Tiene dolores de [place] muy intensos. Alguna vez ha tenido un cogulo de [place]. Dnde buscar ms informacin American Stroke Association (Asociacin Americana de Accidente Cerebrovascular): www.strokeassociation.org Solicite ayuda de inmediato si: Usted o un ser querido tiene algn signo de un accidente cerebrovascular. "BE FAST" es una manera fcil de recordar las seales de advertencia: B: Balance (equilibrio). Mareo, dificultad repentina para caminar o prdida del equilibrio. E: Eyes (ojos). Dificultad para ver o un cambio en cmo ve. F: Face (rostro). Debilidad repentina o prdida de la sensibilidad en la cara. Su rostro o prpado pueden caerse hacia un lado. A: Arms (brazos). Debilidad o prdida de la sensibilidad en un brazo. Esto sucede de repente y con mayor frecuencia en un lado del cuerpo. S: Speech (habla). Dificultad repentina para hablar, hablar arrastrando las palabras o dificultad para comprender lo que las [employer]. T: Time (tiempo). Es tiempo de llamar al servicio de Multimedia programmer. Anote la hora a la que [employer] sntomas. Usted o un ser querido tiene otros signos de un accidente cerebrovascular, como: Dolor de [place] repentino y muy intenso sin causa aparente. Ganas de  vomitar (nuseas). Vmitos. Una convulsin. Estos sntomas pueden Sales executive. Solicite ayuda de inmediato. Comunquese con el servicio de emergencias de su localidad (911 en los [place]  [place]). No espere a ver si los sntomas desaparecen. No conduzca por sus propios medios [employer] hospital. Resumen Puede ayudar a prevenir un accidente cerebrovascular comiendo en forma saludable, haciendo ejercicio y no fumando. Tambin ayuda controlar cualquier problema de salud que usted pueda [place]. No fume ni consuma ningn producto que contenga nicotina o tabaco. Obtenga ayuda de inmediato si usted o un ser querido presenta algn signo de un accidente cerebrovascular. Esta informacin no tiene Marine scientist el consejo del mdico. Asegrese de hacerle al mdico cualquier pregunta que tenga. Document Revised: 03/10/2020 Document Reviewed: 03/10/2020 Elsevier Patient Education  [place].

## 2022-07-12 ENCOUNTER — Encounter: Payer: Self-pay | Admitting: *Deleted
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
## 2022-07-13 ENCOUNTER — Encounter: Payer: Self-pay | Admitting: Student

## 2022-07-13 ENCOUNTER — Ambulatory Visit: Payer: 59 | Attending: General Practice | Admitting: Student

## 2022-07-13 VITALS — BP 102/70 | HR 55 | Ht 60.0 in | Wt 192.4 lb

## 2022-07-13 DIAGNOSIS — I10 Essential (primary) hypertension: Secondary | ICD-10-CM

## 2022-07-13 DIAGNOSIS — H539 Unspecified visual disturbance: Secondary | ICD-10-CM | POA: Diagnosis not present

## 2022-07-13 DIAGNOSIS — E785 Hyperlipidemia, unspecified: Secondary | ICD-10-CM

## 2022-07-13 DIAGNOSIS — Z8673 Personal history of transient ischemic attack (TIA), and cerebral infarction without residual deficits: Secondary | ICD-10-CM | POA: Diagnosis not present

## 2022-07-13 DIAGNOSIS — I639 Cerebral infarction, unspecified: Secondary | ICD-10-CM | POA: Diagnosis not present

## 2022-07-13 LAB — HEPATIC FUNCTION PANEL
ALT: 33 IU/L (ref 0–44)
AST: 26 IU/L (ref 0–40)
Albumin: 4.1 g/dL (ref 3.8–4.9)
Alkaline Phosphatase: 66 IU/L (ref 44–121)
Bilirubin Total: 0.3 mg/dL (ref 0.0–1.2)
Bilirubin, Direct: 0.12 mg/dL (ref 0.00–0.40)
Total Protein: 7.5 g/dL (ref 6.0–8.5)

## 2022-07-13 LAB — LDL CHOLESTEROL, DIRECT: LDL Direct: 48 mg/dL (ref 0–99)

## 2022-07-13 NOTE — Patient Instructions (Signed)
Medication Instructions:  The current medical regimen is effective;  continue present plan and medications as directed. Please refer to the Current Medication list given to you today.  *If you need a refill on your cardiac medications before your next appointment, please call your pharmacy*  Lab Work: DIRECT LDL AND LFT TODAY If you have labs (blood work) drawn today and your tests are completely normal, you will receive your results only by: [place] (if you have MyChart) OR A paper copy in the mail If you have any lab test that is abnormal or we need to change your treatment, we will call you to review the results.  Testing/Procedures: CAROTID ULTRASOUND  Follow-Up: At [facility], you and your health needs are our priority.  As part of our continuing mission to provide you with exceptional heart care, we have created designated Provider Care Teams.  These Care Teams include your primary Cardiologist (physician) and Advanced Practice Providers (APPs -  Physician Assistants and Nurse Practitioners) who all work together to provide you with the care you need, when you need it.  We recommend signing up for the patient portal called "MyChart".  Sign up information is provided on this After Visit Summary.  MyChart is used to connect with patients for Virtual Visits (Telemedicine).  Patients are able to view lab/test results, encounter notes, upcoming appointments, etc.  Non-urgent messages can be sent to your provider as well.   To learn more about what you can do with MyChart, go to [email or web address].    Your next appointment:   4 month(s)  The format for your next appointment:   In Person  Provider:   Kirk Ruths, MD     Other Instructions   Important Information About Sugar

## 2022-07-15 ENCOUNTER — Ambulatory Visit (HOSPITAL_COMMUNITY)
Admission: RE | Admit: 2022-07-15 | Discharge: 2022-07-15 | Disposition: A | Discharge status: Home / Self Care | Payer: 59 | Source: Ambulatory Visit | Attending: Student | Admitting: Student

## 2022-07-15 DIAGNOSIS — Z8673 Personal history of transient ischemic attack (TIA), and cerebral infarction without residual deficits: Secondary | ICD-10-CM | POA: Insufficient documentation

## 2022-07-20 ENCOUNTER — Other Ambulatory Visit: Payer: Self-pay

## 2022-07-20 DIAGNOSIS — Z8673 Personal history of transient ischemic attack (TIA), and cerebral infarction without residual deficits: Secondary | ICD-10-CM

## 2022-07-28 DIAGNOSIS — R202 Paresthesia of skin: Secondary | ICD-10-CM | POA: Diagnosis not present

## 2022-07-28 DIAGNOSIS — E119 Type 2 diabetes mellitus without complications: Secondary | ICD-10-CM | POA: Diagnosis not present

## 2022-08-09 ENCOUNTER — Ambulatory Visit (INDEPENDENT_AMBULATORY_CARE_PROVIDER_SITE_OTHER): Payer: No Typology Code available for payment source | Admitting: Podiatry

## 2022-08-09 DIAGNOSIS — R449 Unspecified symptoms and signs involving general sensations and perceptions: Secondary | ICD-10-CM

## 2022-08-14 ENCOUNTER — Encounter: Payer: Self-pay | Admitting: Podiatry

## 2022-08-14 NOTE — Progress Notes (Signed)
  Subjective:  Patient ID: Austin Moreno, male    DOB: 09/12/1970,  MRN: [id number]  Chief Complaint  Patient presents with   Diabetes    Diabetic foot care, paresthesia of right foot Referring Provider: LUCAS, TIFFANY N - he is having increased numbness and tingling especially in his right foot. Patient takes Plavix    52 y.o. male presents with the above complaint. History confirmed with patient.  He feels this all the way up his leg and shoots from the hip down to the knee.  It is not so much in the foot.  This all began after his stroke  Objective:  Physical Exam: warm, good capillary refill, no trophic changes or ulcerative lesions, normal DP and PT pulses, and no strength deficits, he has abnormal sensory deficits in the right lower extremity.  Assessment:   1. Sensory deficit, right      Plan:  Patient was evaluated and treated and all questions answered.  I discussed with him that my findings appear to be consistent with deficits related to his stroke.  Unfortunately does not seem to have much involved within the foot and ankle itself that I can directly help him with.  He is currently undergoing rehab for this.  I recommended he discuss this further with his neurologist.  Does not have any strength deficits that would necessitate bracing at this point.  He will follow-up me as needed.  Return if symptoms worsen or fail to improve.

## 2022-09-14 DIAGNOSIS — I10 Essential (primary) hypertension: Secondary | ICD-10-CM | POA: Diagnosis not present

## 2022-09-14 DIAGNOSIS — Z7901 Long term (current) use of anticoagulants: Secondary | ICD-10-CM | POA: Diagnosis not present

## 2022-09-14 DIAGNOSIS — E119 Type 2 diabetes mellitus without complications: Secondary | ICD-10-CM | POA: Diagnosis not present

## 2022-10-19 DIAGNOSIS — I10 Essential (primary) hypertension: Secondary | ICD-10-CM | POA: Diagnosis not present

## 2022-10-19 DIAGNOSIS — L918 Other hypertrophic disorders of the skin: Secondary | ICD-10-CM | POA: Diagnosis not present

## 2022-10-19 DIAGNOSIS — E119 Type 2 diabetes mellitus without complications: Secondary | ICD-10-CM | POA: Diagnosis not present
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
## 2022-11-25 ENCOUNTER — Ambulatory Visit: Payer: 59 | Attending: Cardiology | Admitting: Cardiology

## 2023-01-12 DIAGNOSIS — R1013 Epigastric pain: Secondary | ICD-10-CM | POA: Diagnosis not present

## 2023-01-12 DIAGNOSIS — E119 Type 2 diabetes mellitus without complications: Secondary | ICD-10-CM | POA: Diagnosis not present

## 2023-01-12 DIAGNOSIS — R079 Chest pain, unspecified: Secondary | ICD-10-CM | POA: Diagnosis not present

## 2023-01-19 DIAGNOSIS — L918 Other hypertrophic disorders of the skin: Secondary | ICD-10-CM | POA: Diagnosis not present

## 2023-01-19 DIAGNOSIS — E119 Type 2 diabetes mellitus without complications: Secondary | ICD-10-CM | POA: Diagnosis not present

## 2023-01-26 DIAGNOSIS — Z1159 Encounter for screening for other viral diseases: Secondary | ICD-10-CM | POA: Diagnosis not present

## 2023-01-26 DIAGNOSIS — E119 Type 2 diabetes mellitus without complications: Secondary | ICD-10-CM | POA: Diagnosis not present

## 2023-01-26 DIAGNOSIS — Z114 Encounter for screening for human immunodeficiency virus [HIV]: Secondary | ICD-10-CM | POA: Diagnosis not present

## 2023-01-26 DIAGNOSIS — L918 Other hypertrophic disorders of the skin: Secondary | ICD-10-CM | POA: Diagnosis not present

## 2023-01-26 DIAGNOSIS — I10 Essential (primary) hypertension: Secondary | ICD-10-CM | POA: Diagnosis not present

## 2023-05-04 ENCOUNTER — Other Ambulatory Visit: Payer: Self-pay | Admitting: Family

## 2023-05-04 DIAGNOSIS — K7460 Unspecified cirrhosis of liver: Secondary | ICD-10-CM

## 2023-05-10 ENCOUNTER — Other Ambulatory Visit: Payer: 59

## 2023-05-12 ENCOUNTER — Other Ambulatory Visit: Payer: 59

## 2023-07-17 ENCOUNTER — Ambulatory Visit (HOSPITAL_COMMUNITY)
Admission: RE | Admit: 2023-07-17 | Payer: No Typology Code available for payment source | Source: Ambulatory Visit | Attending: Student | Admitting: Student

## 2024-02-01 ENCOUNTER — Other Ambulatory Visit: Payer: Self-pay
